# Patient Record
Sex: Female | Born: 1965 | Race: White | Hispanic: No | Smoking: Current every day smoker
Health system: Southern US, Community
[De-identification: ages and names within clinical notes are randomized; demographics above are authoritative.]

## PROBLEM LIST (undated history)

## (undated) DIAGNOSIS — N200 Calculus of kidney: Secondary | ICD-10-CM

## (undated) DIAGNOSIS — Z8742 Personal history of other diseases of the female genital tract: Secondary | ICD-10-CM

## (undated) DIAGNOSIS — R87619 Unspecified abnormal cytological findings in specimens from cervix uteri: Secondary | ICD-10-CM

## (undated) DIAGNOSIS — F329 Major depressive disorder, single episode, unspecified: Secondary | ICD-10-CM

## (undated) DIAGNOSIS — F419 Anxiety disorder, unspecified: Secondary | ICD-10-CM

## (undated) DIAGNOSIS — S68119A Complete traumatic metacarpophalangeal amputation of unspecified finger, initial encounter: Secondary | ICD-10-CM

## (undated) DIAGNOSIS — Z9889 Other specified postprocedural states: Secondary | ICD-10-CM

## (undated) DIAGNOSIS — D219 Benign neoplasm of connective and other soft tissue, unspecified: Secondary | ICD-10-CM

## (undated) DIAGNOSIS — F32A Depression, unspecified: Secondary | ICD-10-CM

## (undated) DIAGNOSIS — IMO0002 Reserved for concepts with insufficient information to code with codable children: Secondary | ICD-10-CM

## (undated) DIAGNOSIS — D649 Anemia, unspecified: Secondary | ICD-10-CM

## (undated) DIAGNOSIS — O09219 Supervision of pregnancy with history of pre-term labor, unspecified trimester: Secondary | ICD-10-CM

## (undated) DIAGNOSIS — K5790 Diverticulosis of intestine, part unspecified, without perforation or abscess without bleeding: Secondary | ICD-10-CM

## (undated) HISTORY — PX: WISDOM TOOTH EXTRACTION: SHX21

## (undated) HISTORY — PX: TONSILLECTOMY AND ADENOIDECTOMY: SUR1326

## (undated) HISTORY — PX: AMPUTATION FINGER / THUMB: SUR24

## (undated) HISTORY — DX: Complete traumatic metacarpophalangeal amputation of unspecified finger, initial encounter: S68.119A

## (undated) HISTORY — DX: Other specified postprocedural states: Z98.890

## (undated) HISTORY — DX: Reserved for concepts with insufficient information to code with codable children: IMO0002

## (undated) HISTORY — DX: Personal history of other diseases of the female genital tract: Z87.42

## (undated) HISTORY — PX: CERVIX LESION DESTRUCTION: SHX591

## (undated) HISTORY — DX: Unspecified abnormal cytological findings in specimens from cervix uteri: R87.619

## (undated) HISTORY — DX: Calculus of kidney: N20.0

## (undated) HISTORY — DX: Diverticulosis of intestine, part unspecified, without perforation or abscess without bleeding: K57.90

## (undated) HISTORY — DX: Supervision of pregnancy with history of pre-term labor, unspecified trimester: O09.219

## (undated) HISTORY — DX: Benign neoplasm of connective and other soft tissue, unspecified: D21.9

## (undated) HISTORY — DX: Anxiety disorder, unspecified: F41.9

## (undated) HISTORY — PX: ENDOMETRIAL ABLATION: SHX621

## (undated) HISTORY — DX: Depression, unspecified: F32.A

## (undated) HISTORY — DX: Anemia, unspecified: D64.9

## (undated) HISTORY — DX: Major depressive disorder, single episode, unspecified: F32.9

---

## 1998-05-16 ENCOUNTER — Other Ambulatory Visit: Admission: RE | Admit: 1998-05-16 | Discharge: 1998-05-16 | Payer: Self-pay | Admitting: Obstetrics & Gynecology

## 2000-03-25 ENCOUNTER — Other Ambulatory Visit: Admission: RE | Admit: 2000-03-25 | Discharge: 2000-03-25 | Payer: Self-pay | Admitting: Obstetrics and Gynecology

## 2001-06-02 ENCOUNTER — Other Ambulatory Visit: Admission: RE | Admit: 2001-06-02 | Discharge: 2001-06-02 | Payer: Self-pay | Admitting: Obstetrics and Gynecology

## 2001-06-09 ENCOUNTER — Encounter: Payer: Self-pay | Admitting: Obstetrics and Gynecology

## 2001-06-09 ENCOUNTER — Ambulatory Visit (HOSPITAL_COMMUNITY): Admission: RE | Admit: 2001-06-09 | Discharge: 2001-06-09 | Payer: Self-pay | Admitting: Obstetrics and Gynecology

## 2002-06-08 ENCOUNTER — Other Ambulatory Visit: Admission: RE | Admit: 2002-06-08 | Discharge: 2002-06-08 | Payer: Self-pay | Admitting: Obstetrics and Gynecology

## 2007-04-28 ENCOUNTER — Ambulatory Visit (HOSPITAL_COMMUNITY): Admission: RE | Admit: 2007-04-28 | Discharge: 2007-04-28 | Payer: Self-pay | Admitting: *Deleted

## 2011-02-22 ENCOUNTER — Other Ambulatory Visit (HOSPITAL_COMMUNITY): Payer: Self-pay | Admitting: Obstetrics and Gynecology

## 2011-02-22 DIAGNOSIS — Z1231 Encounter for screening mammogram for malignant neoplasm of breast: Secondary | ICD-10-CM

## 2011-02-27 ENCOUNTER — Ambulatory Visit (HOSPITAL_COMMUNITY)
Admission: RE | Admit: 2011-02-27 | Discharge: 2011-02-27 | Disposition: A | Discharge status: Home / Self Care | Payer: Self-pay | Source: Ambulatory Visit | Attending: Obstetrics and Gynecology | Admitting: Obstetrics and Gynecology

## 2011-02-27 DIAGNOSIS — Z1231 Encounter for screening mammogram for malignant neoplasm of breast: Secondary | ICD-10-CM

## 2011-03-01 ENCOUNTER — Emergency Department (HOSPITAL_COMMUNITY)
Admission: EM | Admit: 2011-03-01 | Discharge: 2011-03-01 | Disposition: A | Discharge status: Home / Self Care | Payer: No Typology Code available for payment source | Attending: Emergency Medicine | Admitting: Emergency Medicine

## 2011-03-01 ENCOUNTER — Encounter (HOSPITAL_COMMUNITY): Payer: Self-pay | Admitting: *Deleted

## 2011-03-01 ENCOUNTER — Emergency Department (HOSPITAL_COMMUNITY): Payer: No Typology Code available for payment source

## 2011-03-01 DIAGNOSIS — S6990XA Unspecified injury of unspecified wrist, hand and finger(s), initial encounter: Secondary | ICD-10-CM | POA: Insufficient documentation

## 2011-03-01 DIAGNOSIS — S6980XA Other specified injuries of unspecified wrist, hand and finger(s), initial encounter: Secondary | ICD-10-CM | POA: Insufficient documentation

## 2011-03-01 DIAGNOSIS — W230XXA Caught, crushed, jammed, or pinched between moving objects, initial encounter: Secondary | ICD-10-CM | POA: Insufficient documentation

## 2011-03-01 DIAGNOSIS — IMO0002 Reserved for concepts with insufficient information to code with codable children: Secondary | ICD-10-CM | POA: Insufficient documentation

## 2011-03-01 DIAGNOSIS — S68626A Partial traumatic transphalangeal amputation of right little finger, initial encounter: Secondary | ICD-10-CM

## 2011-03-01 DIAGNOSIS — M79609 Pain in unspecified limb: Secondary | ICD-10-CM | POA: Insufficient documentation

## 2011-03-01 DIAGNOSIS — S61209A Unspecified open wound of unspecified finger without damage to nail, initial encounter: Secondary | ICD-10-CM | POA: Insufficient documentation

## 2011-03-01 LAB — POCT I-STAT, CHEM 8
BUN: 17 mg/dL (ref 6–23)
Creatinine, Ser: 0.6 mg/dL (ref 0.50–1.10)
Sodium: 141 mEq/L (ref 135–145)
TCO2: 26 mmol/L (ref 0–100)

## 2011-03-01 LAB — CBC
HCT: 41.3 % (ref 36.0–46.0)
MCH: 26.9 pg (ref 26.0–34.0)
MCHC: 31.2 g/dL (ref 30.0–36.0)
MCV: 86 fL (ref 78.0–100.0)
RDW: 16 % — ABNORMAL HIGH (ref 11.5–15.5)
WBC: 5.9 10*3/uL (ref 4.0–10.5)

## 2011-03-01 MED ORDER — CEFAZOLIN SODIUM 1-5 GM-% IV SOLN
1.0000 g | Freq: Once | INTRAVENOUS | Status: DC
  Filled 2011-03-01: qty 50

## 2011-03-01 MED ORDER — LORAZEPAM 2 MG/ML IJ SOLN
1.0000 mg | Freq: Once | INTRAMUSCULAR | Status: AC
  Administered 2011-03-01: 1 mg via INTRAVENOUS
  Filled 2011-03-01: qty 1

## 2011-03-01 MED ORDER — HYDROMORPHONE HCL PF 1 MG/ML IJ SOLN
INTRAMUSCULAR | Status: AC
  Filled 2011-03-01: qty 1

## 2011-03-01 MED ORDER — ONDANSETRON HCL 4 MG/2ML IJ SOLN
4.0000 mg | Freq: Once | INTRAMUSCULAR | Status: AC
  Administered 2011-03-01: 4 mg via INTRAVENOUS
  Filled 2011-03-01: qty 2

## 2011-03-01 MED ORDER — HYDROMORPHONE HCL PF 1 MG/ML IJ SOLN
0.5000 mg | Freq: Once | INTRAMUSCULAR | Status: AC
  Administered 2011-03-01: 0.5 mg via INTRAVENOUS
  Filled 2011-03-01: qty 1

## 2011-03-01 MED ORDER — HYDROMORPHONE HCL PF 1 MG/ML IJ SOLN: 0.5000 mg | Freq: Once | INTRAMUSCULAR | Status: DC

## 2011-03-01 MED ORDER — BUPIVACAINE HCL (PF) 0.5 % IJ SOLN
INTRAMUSCULAR | Status: AC
  Filled 2011-03-01: qty 10

## 2011-03-01 MED ORDER — LORAZEPAM 1 MG PO TABS: 0.5000 mg | ORAL_TABLET | Freq: Three times a day (TID) | ORAL | Status: AC | PRN

## 2011-03-01 MED ORDER — TETANUS-DIPHTH-ACELL PERTUSSIS 5-2.5-18.5 LF-MCG/0.5 IM SUSP
0.5000 mL | Freq: Once | INTRAMUSCULAR | Status: AC
  Administered 2011-03-01: 0.5 mL via INTRAMUSCULAR
  Filled 2011-03-01: qty 0.5

## 2011-03-01 NOTE — ED Provider Notes (Signed)
I saw and evaluated the patient, reviewed the resident's note and I agree with the findings and plan.  Cyndra Numbers, MD 03/01/11 2216

## 2011-03-01 NOTE — ED Notes (Signed)
Provided pt with primary care resources for f/u.

## 2011-03-01 NOTE — ED Notes (Signed)
Pt amputated half of her right pinky finger with a wood chopper.  Pt has finger with her.  Bleeding controlled

## 2011-03-01 NOTE — ED Provider Notes (Signed)
History     CSN: [id number]  Arrival date & time 03/01/11  1304   First MD Initiated Contact with Patient 03/01/11 1310      Chief Complaint  Patient presents with  . Hand Injury    (Consider location/radiation/quality/duration/timing/severity/associated sxs/prior treatment) Patient is a 46 y.o. female presenting with hand injury. The history is provided by the patient.  Hand Injury  The incident occurred less than 1 hour ago. The incident occurred at home. Injury mechanism: finger caught in wood splitter. Pain location: right little finger. The quality of the pain is described as aching. The pain is moderate. The pain has been constant since the incident. Pertinent negatives include no fever and no malaise/fatigue. It is unknown if a foreign body is present. The symptoms are aggravated by movement. She has tried elevation and ice for the symptoms. The treatment provided mild relief.    History reviewed. No pertinent past medical history.  Past Surgical History  Procedure Date  . Cesarean section     No family history on file.  History  Substance Use Topics  . Smoking status: Current Everyday Smoker  . Smokeless tobacco: Not on file  . Alcohol Use: Yes    OB History    Grav Para Term Preterm Abortions TAB SAB Ect Mult Living                  Review of Systems  Constitutional: Negative for fever, chills, malaise/fatigue, activity change, appetite change and fatigue.  HENT: Negative for congestion, sore throat, rhinorrhea, neck pain and neck stiffness.   Eyes: Negative for photophobia, redness and visual disturbance.  Respiratory: Negative for cough, shortness of breath and wheezing.   Cardiovascular: Negative for chest pain, palpitations and leg swelling.  Gastrointestinal: Negative for nausea, vomiting, abdominal pain, diarrhea, constipation and blood in stool.  Genitourinary: Negative for dysuria, urgency, hematuria and flank pain.  Musculoskeletal: Negative for  back pain.  Skin: Positive for wound (cut off tip of little finger right hand). Negative for rash.  Neurological: Negative for dizziness, seizures, facial asymmetry, speech difficulty, weakness, light-headedness, numbness and headaches.  Psychiatric/Behavioral: Negative for confusion.  All other systems reviewed and are negative.    Allergies  Penicillins  Home Medications   Current Outpatient Rx  Name Route Sig Dispense Refill  . IBUPROFEN 200 MG PO TABS Oral Take 200 mg by mouth every 6 (six) hours as needed. For pain    . ADULT MULTIVITAMIN W/MINERALS CH Oral Take 1 tablet by mouth daily.    Marland Kitchen ESTROVEN PO Oral Take 1 tablet by mouth daily.    Marland Kitchen VITAMIN B-12 500 MCG PO TABS Oral Take 1,000 mcg by mouth daily.    Marland Kitchen VITAMIN D (CHOLECALCIFEROL) PO Oral Take 1 tablet by mouth daily.      BP 189/93  Pulse 104  Temp(Src) 98 F (36.7 C) (Oral)  Resp 25  SpO2 98%  LMP 12/28/2010  Physical Exam  Nursing note and vitals reviewed. Constitutional: She is oriented to person, place, and time. She appears well-developed and well-nourished.  Non-toxic appearance. No distress.  HENT:  Head: Normocephalic and atraumatic.  Mouth/Throat: Oropharynx is clear and moist.  Eyes: Conjunctivae and EOM are normal. Pupils are equal, round, and reactive to light. No scleral icterus.  Neck: Normal range of motion. Neck supple. No JVD present.  Cardiovascular: Normal rate, regular rhythm, normal heart sounds and intact distal pulses.   No murmur heard. Pulmonary/Chest: Effort normal and breath sounds normal. No  respiratory distress. She has no wheezes. She has no rales.  Abdominal: Soft. Bowel sounds are normal. She exhibits no distension. There is no tenderness. There is no rebound and no guarding.  Musculoskeletal: Normal range of motion.       Arms:      Partial amputation of little finger of right hand. Amputation at middle phalanx. Exposed bone present. No active bleeding. Motion at MCP joint  intact.   Neurological: She is alert and oriented to person, place, and time. She has normal strength. No cranial nerve deficit. GCS eye subscore is 4. GCS verbal subscore is 5. GCS motor subscore is 6.  Skin: Skin is warm and dry. No rash noted. She is not diaphoretic.  Psychiatric: She has a normal mood and affect.    ED Course  Procedures (including critical care time)  Labs Reviewed  CBC - Abnormal; Notable for the following:    RDW 16.0 (*)    All other components within normal limits  POCT I-STAT, CHEM 8 - Abnormal; Notable for the following:    Glucose, Bld 109 (*)    Calcium, Ion 1.40 (*)    All other components within normal limits  PROTIME-INR  I-STAT, CHEM 8   Dg Hand Complete Right  03/01/2011  *RADIOLOGY REPORT*  Clinical Data: Amputation of the distal fifth digit.  Caught in wood splitter.  RIGHT HAND - COMPLETE 3+ VIEW  Comparison: None.  Findings: There is amputation of the fifth digit, at the mid shaft of the middle phalanx.  Proximal interphalangeal joint is intact. Osseous structures are otherwise unremarkable.  IMPRESSION: Partial amputation of the fifth digit, at the mid shaft of the middle phalanx.  Original Report Authenticated By: Reyes Ivan, M.D.     1. Partial traumatic transphalangeal amputation of right little finger       MDM  45yo CF with PMH significant for anxiety who presents to the ED due to finger amputation. Occurred while placing wood in a wood splitter and her finger got caught in the machine. Exposed bone present. No active bleeding. Giving ancef and updated TDAP. Giving dilaudid. Patient requesting low dose pain medication because pain meds make her nauseous. Xray ordered.   Hand surgery consulted on arrival. He is on his way to the ED.   Hand xray with no other injuries.   Hand surgeon says not reimplantable amputation. He is revising wound.   Hand surgeon giving percocet and doxy will f/u with pt in clinic. Pt requesting help  with scripts as no insurance.   Social work providing resources for medications. Will give her a few doses of ativan for her anxiety related to injury and pain.      Verne Carrow, MD 03/01/11 [phone]

## 2011-03-01 NOTE — ED Notes (Signed)
Dr. Allie Bossier completed procedure, pt verbalizes understanding of follow up care & prescriptions, family at bedside, pt remains calm & cooperative, denies pain, NAD

## 2011-03-02 NOTE — Consult Note (Signed)
Kathleen Melendez, Kathleen Melendez NO.:  [id number]  MEDICAL RECORD NO.:  [id number]  LOCATION:  CDU11                        FACILITY:  [facility]  PHYSICIAN:  Kathleen Melendez, M.D.DATE OF BIRTH:  06-26-1965  DATE OF CONSULTATION:  03/01/2011 DATE OF DISCHARGE:  03/01/2011                                CONSULTATION   CONSULTING PHYSICIAN:  ER MD/EDP.  REASON FOR CONSULTATION:  Traumatic amputation of right 3rd finger through the middle phalanx.  HISTORY OF PRESENT ILLNESS:  [name] is a 46 year old who was chopping wood in her yard today.  She has a wood burning stove.  She accidentally cut off her right hand 5th finger through the middle phalanx.  She brought the finger to the emergency room with her.  She reports this is her only injury.  PAST MEDICAL HISTORY:  No active medical problems.  ALLERGIES:  Penicillin.  MEDICATIONS:  No active medications.  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  REVIEW OF SYSTEMS:  Negative for chest pain, shortness of breath, fever, chills, nausea, and vomiting.  PHYSICAL EXAM:  She is afebrile.  Stable vital signs.  Recent examination of her right hand 5th finger shows a jagged exposed bone of the middle phalanx with crushed soft tissue.  The distal amputation piece is with her, but is not viable for replantation.  When I explained this in length that replantation at this level does not work and she has too much crushed tissue.  ASSESSMENT:  Right fifth finger traumatic amputation through middle phalanx.  PLAN:  Given the nature of the crushed bone in this area as well as the soft tissues, they will allow a replantation and given the historical fact that these do not do well at this level, she wished to proceed with a revision amputation which we can do here in the emergency room under local block.  We will perform this in the emergency room and I will dictate this in a separate procedure note.   I will then see her back in 3-4 days after procedure and I will give her information for antibiotics, pain medication, and followup information.     Kathleen Melendez, M.D.     CYB/MEDQ  D:  03/01/2011  T:  03/02/2011  Job:  [id number]

## 2011-03-02 NOTE — Op Note (Signed)
NAMEBETTYLOU, [name] NO.:  [id number]  MEDICAL RECORD NO.:  [id number]  LOCATION:  CDU11                        FACILITY:  [facility]  PHYSICIAN:  Vanita Panda. Magnus Ivan, M.D.DATE OF BIRTH:  11/20/1965  DATE OF PROCEDURE:  03/01/2011 DATE OF DISCHARGE:  03/01/2011                              OPERATIVE REPORT   PREPROCEDURE DIAGNOSIS:  Right hand fifth finger traumatic amputation through the middle phalanx.  POSTPROCEDURE DIAGNOSIS:  Right hand fifth finger traumatic amputation through the middle phalanx.  PROCEDURE:  Revision amputation of right hand fifth finger.  SURGEON:  Kathleen Poisson MD.  ANESTHESIA: 1. 2% plain lidocaine digital block. 2. 0.25% plain bupivacaine digital block.  BLOOD LOSS:  Minimal.  COMPLICATIONS:  None.  INDICATIONS:  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  She was chopping wood and she actually cut off her fifth finger on her right dominant hand to the middle phalanx.  This is a jagged cut and she brought the finger with her to the emergency room; however this is not in level they can have a replant.  I talked to her about revising this with backing up the bone and then closing the tissue over the bone at the level of the amputation.  She understood this and did wish to proceed with the procedure.  Again, this is a AAR procedure.  PROCEDURE DESCRIPTION:  I did clean her hand with Betadine and provided a digital block first with 2% plain lidocaine followed by 0.25% plain bupivacaine.  I got adequate anesthesia with this.  There was exposed bone of the middle phalanx and crest tissue around this.  I debrided the crest tissue sharply with a knife and I used a rongeur to back the bone up to a level of record bring the flap over.  I cauterized the neurovascular bundles with electrocautery, and then I clean the tissues thoroughly.  There was no gross debris in the wound.  I fashioned a  flap to bring the skin margins together with interrupted 4-0 Prolene suture.  I then placed a Xeroform and well-padded dressing and she tolerated this well and was discharged from the emergency room with no problems.  I will see her back in the office in 3-4 days.     Vanita Panda. Magnus Ivan, M.D.     CYB/MEDQ  D:  03/01/2011  T:  03/02/2011  Job:  [id number]

## 2011-04-27 ENCOUNTER — Emergency Department (HOSPITAL_COMMUNITY)
Admission: EM | Admit: 2011-04-27 | Discharge: 2011-04-27 | Disposition: A | Discharge status: Home / Self Care | Payer: Medicaid Other | Attending: Emergency Medicine | Admitting: Emergency Medicine

## 2011-04-27 ENCOUNTER — Emergency Department (HOSPITAL_COMMUNITY): Payer: Medicaid Other

## 2011-04-27 ENCOUNTER — Encounter (HOSPITAL_COMMUNITY): Payer: Self-pay | Admitting: *Deleted

## 2011-04-27 DIAGNOSIS — R109 Unspecified abdominal pain: Secondary | ICD-10-CM | POA: Insufficient documentation

## 2011-04-27 DIAGNOSIS — M549 Dorsalgia, unspecified: Secondary | ICD-10-CM | POA: Insufficient documentation

## 2011-04-27 DIAGNOSIS — N200 Calculus of kidney: Secondary | ICD-10-CM | POA: Insufficient documentation

## 2011-04-27 DIAGNOSIS — N23 Unspecified renal colic: Secondary | ICD-10-CM

## 2011-04-27 DIAGNOSIS — N201 Calculus of ureter: Secondary | ICD-10-CM | POA: Insufficient documentation

## 2011-04-27 LAB — PREGNANCY, URINE: Preg Test, Ur: NEGATIVE

## 2011-04-27 LAB — URINE MICROSCOPIC-ADD ON

## 2011-04-27 LAB — URINALYSIS, ROUTINE W REFLEX MICROSCOPIC
Glucose, UA: NEGATIVE mg/dL
Protein, ur: NEGATIVE mg/dL
Specific Gravity, Urine: 1.021 (ref 1.005–1.030)

## 2011-04-27 MED ORDER — HYDROCODONE-ACETAMINOPHEN 5-325 MG PO TABS
2.0000 | ORAL_TABLET | Freq: Once | ORAL | Status: AC
  Administered 2011-04-27: 2 via ORAL
  Filled 2011-04-27: qty 2

## 2011-04-27 MED ORDER — ONDANSETRON HCL 8 MG PO TABS: 8.0000 mg | ORAL_TABLET | Freq: Three times a day (TID) | ORAL | Status: AC | PRN

## 2011-04-27 MED ORDER — ONDANSETRON 4 MG PO TBDP
4.0000 mg | ORAL_TABLET | Freq: Once | ORAL | Status: AC
  Administered 2011-04-27: 4 mg via ORAL
  Filled 2011-04-27: qty 1

## 2011-04-27 MED ORDER — OXYCODONE-ACETAMINOPHEN 5-325 MG PO TABS: 1.0000 | ORAL_TABLET | Freq: Four times a day (QID) | ORAL | Status: AC | PRN

## 2011-04-27 NOTE — ED Notes (Signed)
PT SEEN AND ASSESSED BY DR. Richardo Priest. PT TO BE D/C TO HOME

## 2011-04-27 NOTE — ED Provider Notes (Signed)
History     CSN: [id number]  Arrival date & time 04/27/11  1330   First MD Initiated Contact with Patient 04/27/11 1425      Chief Complaint  Patient presents with  . Flank Pain    (Consider location/radiation/quality/duration/timing/severity/associated sxs/prior treatment) Patient is a 46 y.o. female presenting with flank pain. The history is provided by the patient.  Flank Pain Pertinent negatives include no chest pain, no abdominal pain, no headaches and no shortness of breath.  pt c/o right flank pain since yesterday. Constant, dull, nonradiating, waxing and waning intensity. Similar to prior kidney stone pain. No anterior/abd pain. No nvd. No constipation. No vaginal discharge or bleeding. States unsure when last period, is perimenopausal and states has numerous fibroids. No dysuria or hematuria. No fever or chills. No back injury or strain. No radicular pain. No numbness/weakness.   History reviewed. No pertinent past medical history.  Past Surgical History  Procedure Date  . Cesarean section   . Cesarean section   . Amputation finger / thumb     History reviewed. No pertinent family history.  History  Substance Use Topics  . Smoking status: Current Everyday Smoker  . Smokeless tobacco: Not on file  . Alcohol Use: Yes    OB History    Grav Para Term Preterm Abortions TAB SAB Ect Mult Living                  Review of Systems  Constitutional: Negative for fever and chills.  HENT: Negative for neck pain.   Respiratory: Negative for cough and shortness of breath.   Cardiovascular: Negative for chest pain.  Gastrointestinal: Negative for abdominal pain.  Genitourinary: Positive for flank pain.  Musculoskeletal: Positive for back pain.  Skin: Negative for rash.  Neurological: Negative for headaches.  Hematological: Does not bruise/bleed easily.  Psychiatric/Behavioral: Negative for confusion.    Allergies  Penicillins  Home Medications   Current  Outpatient Rx  Name Route Sig Dispense Refill  . AMOXICILLIN 250 MG PO CAPS Oral Take 500 mg by mouth once.    . IBUPROFEN 200 MG PO TABS Oral Take 200 mg by mouth every 6 (six) hours as needed. For pain    . ADULT MULTIVITAMIN W/MINERALS CH Oral Take 1 tablet by mouth daily.    Marland Kitchen ESTROVEN PO Oral Take 1 tablet by mouth daily.    Marland Kitchen VITAMIN B-12 500 MCG PO TABS Oral Take 1,000 mcg by mouth daily.    Marland Kitchen VITAMIN D (CHOLECALCIFEROL) PO Oral Take 1 tablet by mouth daily.      BP 160/88  Pulse 68  Temp(Src) 97.9 F (36.6 C) (Oral)  Resp 18  SpO2 100%  Physical Exam  Nursing note and vitals reviewed. Constitutional: She is oriented to person, place, and time. She appears well-developed and well-nourished. No distress.  Eyes: Conjunctivae are normal. No scleral icterus.  Neck: Neck supple. No tracheal deviation present.  Cardiovascular: Normal rate, regular rhythm, normal heart sounds and intact distal pulses.   Pulmonary/Chest: Effort normal and breath sounds normal. No respiratory distress.  Abdominal: Soft. Normal appearance and bowel sounds are normal. She exhibits no distension and no mass. There is no tenderness. There is no rebound and no guarding.  Genitourinary:       No cva tenderness  Musculoskeletal: She exhibits no edema.       tls spine non tender, aligned, no step off  Neurological: She is alert and oriented to person, place, and time.  Steady gait.   Skin: Skin is warm and dry. No rash noted.  Psychiatric: She has a normal mood and affect.    ED Course  Procedures (including critical care time)   Labs Reviewed  URINALYSIS, ROUTINE W REFLEX MICROSCOPIC  PREGNANCY, URINE   Results for orders placed during the hospital encounter of 04/27/11  URINALYSIS, ROUTINE W REFLEX MICROSCOPIC      Component Value Range   Color, Urine YELLOW  YELLOW    APPearance CLEAR  CLEAR    Specific Gravity, Urine 1.021  1.005 - 1.030    pH 6.0  5.0 - 8.0    Glucose, UA NEGATIVE   NEGATIVE (mg/dL)   Hgb urine dipstick LARGE (*) NEGATIVE    Bilirubin Urine NEGATIVE  NEGATIVE    Ketones, ur NEGATIVE  NEGATIVE (mg/dL)   Protein, ur NEGATIVE  NEGATIVE (mg/dL)   Urobilinogen, UA 0.2  0.0 - 1.0 (mg/dL)   Nitrite NEGATIVE  NEGATIVE    Leukocytes, UA NEGATIVE  NEGATIVE   PREGNANCY, URINE      Component Value Range   Preg Test, Ur NEGATIVE  NEGATIVE   URINE MICROSCOPIC-ADD ON      Component Value Range   Squamous Epithelial / LPF FEW (*) RARE    WBC, UA 0-2  <3 (WBC/hpf)   RBC / HPF 11-20  <3 (RBC/hpf)   Bacteria, UA FEW (*) RARE    Ct Abdomen Pelvis Wo Contrast  04/27/2011  *RADIOLOGY REPORT*  Clinical Data: Right flank pain.  Dysuria.  History of uterine fibroids.  CT ABDOMEN AND PELVIS WITHOUT CONTRAST 04/27/2011:  Technique:  Multidetector CT imaging of the abdomen and pelvis was performed following the standard protocol without intravenous contrast.  Comparison: None.  Findings: Obstructing approximate 4 mm calculus at the right ureterovesical junction causing moderate right hydronephrosis, marked right renal edema, and marked right perinephric edema.  No other right urinary tract calculi.  At least 4 calculi in lower pole calyces of the left kidney, the largest measuring approximately 5 x 9 mm.  No left ureteral calculi and no left urinary tract obstruction.  Within the limits of the unenhanced technique, no focal parenchymal abnormality involving either kidney.  Normal unenhanced appearance of the liver, spleen, pancreas, and adrenal glands.  Gallbladder unremarkable by CT.  No biliary ductal dilation.  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  No significant lymphadenopathy.  Stomach and small bowel normal in appearance.  Extensive sigmoid colon diverticulosis without evidence of acute diverticulitis; remainder of the colon unremarkable.  Normal appendix in the right upper pelvis.  No ascites.  Uterus enlarged containing fibroids, including a calcified,  degenerated fibroid arising from the fundus.  No adnexal masses or free pelvic fluid.  Phleboliths low in both sides of the pelvis. Bone window images demonstrate degenerative changes in the facet joints of the lower lumbar spine.  Visualized lung bases clear.  IMPRESSION:  1.  Obstructing approximate 4 mm calculus at the right UVJ. 2.  At least 4 calculi in lower pole calyces of the left kidney, non-obstructive, the largest approximating 5 x 9 mm. 3.  Sigmoid colon diverticulosis without evidence of acute diverticulitis. 4.  Enlarged uterus containing fibroids.  Original Report Authenticated By: Arnell Sieving, M.D.      MDM  Labs.  vicodin po (pt has ride, does not have to drive).    Recheck pain resolved. abd soft nt. Discussed ct w pt.      Suzi Roots, MD 04/27/11 [phone]

## 2011-04-27 NOTE — Discharge Instructions (Signed)
Take motrin or aleve as need for pain. You may also take percocet as need for pain. No driving for the next 6 hours or when taking percocet. Also, do not take tylenol or acetaminophen containing medication when taking percocet.    Your ct scan was read as showing a 4 mm right sided kidney stone at the UVJ. Incidental note was also made of 4 kidney stones in the left kidney, and diverticula of the colon - discuss with your doctor at follow up with them.  Follow up with urologist in coming week if symptoms fail to improve/resolve.  Your blood pressure is high today - follow up with primary care doctor for recheck in the next couple weeks.   Return to ER if worse, severe pain, persistent vomiting, fevers, other concern.  You were given pain medication in the ER - no driving for the next 6 hours.       Kidney Stones Kidney stones (ureteral lithiasis) are deposits that form inside your kidneys. The intense pain is caused by the stone moving through the urinary tract. When the stone moves, the ureter goes into spasm around the stone. The stone is usually passed in the urine.  CAUSES   A disorder that makes certain neck glands produce too much parathyroid hormone (primary hyperparathyroidism).   A buildup of uric acid crystals.   Narrowing (stricture) of the ureter.   A kidney obstruction present at birth (congenital obstruction).   Previous surgery on the kidney or ureters.   Numerous kidney infections.  SYMPTOMS   Feeling sick to your stomach (nauseous).   Throwing up (vomiting).   Blood in the urine (hematuria).   Pain that usually spreads (radiates) to the groin.   Frequency or urgency of urination.  DIAGNOSIS   Taking a history and physical exam.   Blood or urine tests.   Computerized X-ray scan (CT scan).   Occasionally, an examination of the inside of the urinary bladder (cystoscopy) is performed.  TREATMENT   Observation.   Increasing your fluid intake.    Surgery may be needed if you have severe pain or persistent obstruction.  The size, location, and chemical composition are all important variables that will determine the proper choice of action for you. Talk to your caregiver to better understand your situation so that you will minimize the risk of injury to yourself and your kidney.  HOME CARE INSTRUCTIONS   Drink enough water and fluids to keep your urine clear or pale yellow.   Strain all urine through the provided strainer. Keep all particulate matter and stones for your caregiver to see. The stone causing the pain may be as small as a grain of salt. It is very important to use the strainer each and every time you pass your urine. The collection of your stone will allow your caregiver to analyze it and verify that a stone has actually passed.   Only take over-the-counter or prescription medicines for pain, discomfort, or fever as directed by your caregiver.   Make a follow-up appointment with your caregiver as directed.   Get follow-up X-rays if required. The absence of pain does not always mean that the stone has passed. It may have only stopped moving. If the urine remains completely obstructed, it can cause loss of kidney function or even complete destruction of the kidney. It is your responsibility to make sure X-rays and follow-ups are completed. Ultrasounds of the kidney can show blockages and the status of the kidney. Ultrasounds are  not associated with any radiation and can be performed easily in a matter of minutes.  SEEK IMMEDIATE MEDICAL CARE IF:   Pain cannot be controlled with the prescribed medicine.   You have a fever.   The severity or intensity of pain increases over 18 hours and is not relieved by pain medicine.   You develop a new onset of abdominal pain.   You feel faint or pass out.  MAKE SURE YOU:   Understand these instructions.   Will watch your condition.   Will get help right away if you are not doing  well or get worse.  Document Released: 01/22/2005 Document Revised: 01/11/2011 Document Reviewed: 05/20/2009 [facility] Patient Information 2012 [place], [place].     Diverticulosis Diverticulosis is a common condition that develops when small pouches (diverticula) form in the wall of the colon. The risk of diverticulosis increases with age. It happens more often in people who eat a low-fiber diet. Most individuals with diverticulosis have no symptoms. Those individuals with symptoms usually experience abdominal pain, constipation, or loose stools (diarrhea). HOME CARE INSTRUCTIONS   Increase the amount of fiber in your diet as directed by your caregiver or dietician. This may reduce symptoms of diverticulosis.   Your caregiver may recommend taking a dietary fiber supplement.   Drink at least 6 to 8 glasses of water each day to prevent constipation.   Try not to strain when you have a bowel movement.   Your caregiver may recommend avoiding nuts and seeds to prevent complications, although this is still an uncertain benefit.   Only take over-the-counter or prescription medicines for pain, discomfort, or fever as directed by your caregiver.  FOODS WITH HIGH FIBER CONTENT INCLUDE:  Fruits. Apple, peach, pear, tangerine, raisins, prunes.   Vegetables. Brussels sprouts, asparagus, broccoli, cabbage, carrot, cauliflower, romaine lettuce, spinach, summer squash, tomato, winter squash, zucchini.   Starchy Vegetables. Baked beans, kidney beans, lima beans, split peas, lentils, potatoes (with skin).   Grains. Whole wheat bread, brown rice, bran flake cereal, plain oatmeal, white rice, shredded wheat, bran muffins.  SEEK IMMEDIATE MEDICAL CARE IF:   You develop increasing pain or severe bloating.   You have an oral temperature above 102 F (38.9 C), not controlled by medicine.   You develop vomiting or bowel movements that are bloody or black.  Document Released: 10/20/2003 Document Revised:  01/11/2011 Document Reviewed: 06/22/2009 [facility] Patient Information 2012 [place], [place].

## 2011-04-27 NOTE — ED Notes (Signed)
Patient is alert and oriented x3.  She is complaining of right flank pain that started yesterday  And has increased in severity.  She is rating her pain 8 of 10. She states that she has had A kidney stone in the past.  Currently she is having frequency, urgency and difficulty urinating.

## 2011-06-12 ENCOUNTER — Encounter: Payer: Self-pay | Admitting: Obstetrics and Gynecology

## 2011-06-12 ENCOUNTER — Ambulatory Visit (INDEPENDENT_AMBULATORY_CARE_PROVIDER_SITE_OTHER): Payer: Medicaid Other | Admitting: Obstetrics and Gynecology

## 2011-06-12 VITALS — BP 132/60 | HR 60 | Ht 65.0 in | Wt 127.0 lb

## 2011-06-12 DIAGNOSIS — N200 Calculus of kidney: Secondary | ICD-10-CM

## 2011-06-12 DIAGNOSIS — F419 Anxiety disorder, unspecified: Secondary | ICD-10-CM | POA: Insufficient documentation

## 2011-06-12 DIAGNOSIS — F411 Generalized anxiety disorder: Secondary | ICD-10-CM

## 2011-06-12 DIAGNOSIS — N920 Excessive and frequent menstruation with regular cycle: Secondary | ICD-10-CM

## 2011-06-12 DIAGNOSIS — Z01419 Encounter for gynecological examination (general) (routine) without abnormal findings: Secondary | ICD-10-CM

## 2011-06-12 DIAGNOSIS — Z124 Encounter for screening for malignant neoplasm of cervix: Secondary | ICD-10-CM

## 2011-06-12 DIAGNOSIS — D259 Leiomyoma of uterus, unspecified: Secondary | ICD-10-CM

## 2011-06-12 DIAGNOSIS — D649 Anemia, unspecified: Secondary | ICD-10-CM

## 2011-06-12 DIAGNOSIS — Z0000 Encounter for general adult medical examination without abnormal findings: Secondary | ICD-10-CM

## 2011-06-12 DIAGNOSIS — R87619 Unspecified abnormal cytological findings in specimens from cervix uteri: Secondary | ICD-10-CM | POA: Insufficient documentation

## 2011-06-12 DIAGNOSIS — D219 Benign neoplasm of connective and other soft tissue, unspecified: Secondary | ICD-10-CM

## 2011-06-12 DIAGNOSIS — IMO0002 Reserved for concepts with insufficient information to code with codable children: Secondary | ICD-10-CM | POA: Insufficient documentation

## 2011-06-12 NOTE — Progress Notes (Signed)
Regular Periods: no Mammogram: yes  Monthly Breast Ex.: yes C/O SORE BREAST Exercise: yes  Tetanus < 10 years: yes Seatbelts: yes  NI. Bladder Functn.: yes Abuse at home: no  Daily BM's: yes Stressful Work: yes  Healthy Diet: yes Sigmoid-Colonoscopy: NEVER  Calcium: yes Medical problems this year: FIBROIDS   LAST PAP:2011 NL  Contraception: CONDOMS  Mammogram:  02/2011 NL PCP:DR. Lerry Liner  PMH: NO CHANGE  FMH: NO CHANGE  Last Bone Scan:NEVER  PT IS SEPARATED

## 2011-06-12 NOTE — Progress Notes (Signed)
Subjective:    Kathleen Melendez is a 46 y.o. female, G2P0011, who presents for an annual exam. Patient has a long history of uterine fibroids and with recent CT evaluation had that fact confirmed.  After passing the renal stone the patient bled heavy  x 3 weeks using a  box of 40 tampons in 48 hours along with long pads.  Patient had 10/10 cramps that decreased to 7/10 with Ibuprofen 800 mg. She then was without bleeding x 1 week but then bled heavy x 1 week again.  She gives a history of 2 months without a period in the past year along with vasomotor symptoms but they have subsided.  Denes lightheadedness, chest pain, or  shortness of breath.  Menstrual cycle:   LMP: Patient's last menstrual period was 04/27/2011.  See HPI for characteristics.            Review of Systems Pertinent items are noted in HPI. Denies pelvic pain, uti symptoms, vaginitis symptoms,  menopausal symptoms, change in bowel habits or rectal bleeding   Objective:    BP 132/60  Pulse 60  Ht 5\' 5"  (1.651 m)  Wt 127 lb (57.607 kg)  BMI 21.13 kg/m2  LMP 04/27/2011   Wt Readings from Last 1 Encounters:  06/12/11 127 lb (57.607 kg)   BMI: Body mass index is 21.13 kg/(m^2). General Appearance: Alert with . No acute distress HEENT: Grossly normal Neck / Thyroid: Supple, no thyromegaly or cervical adenopathy Lungs: clear to auscultation bilaterally Back: No CVA tenderness Breast Exam: No masses or nodes.No dimpling, nipple retraction or discharge. Cardiovascular: Regular rate and rhythm.  Gastrointestinal: Soft, non-tender, no masses or organomegaly Pelvic Exam: EGBUS-wnl, vagina-normal rugae, cervix- without lesions or tenderness, uterus 16-18 weeks size, slightly mobile,  adnexae-no masses or tenderness Rectovaginal: hemorrhoids and normal sphincter tone Lymphatic Exam: Non-palpable nodes in neck, clavicular,  axillary, or inguinal regions  Skin: no rashes or abnormalities Extremities: no clubbing cyanosis or edema    Neurologic: grossly normal Psychiatric: Alert and oriented, appropriate affect.    Assessment:   Routine GYN Exam Large Symptomatic Uterine Fibroids Menorrhagia   Plan:  Reviewed medical and surgical management options for fibroids and menorrhagia to include: Mirena, fibroid embolization, myomectomy, ablation and hysterectomy but patient wants hysterectomy  PAP sent  To notify Dr. Su Hilt of patient's request for surgery Reviewed endometrial biopsy-patient has had before.   Aftan Vint,ELMIRAPA-C

## 2011-06-20 ENCOUNTER — Telehealth: Payer: Self-pay | Admitting: Obstetrics and Gynecology

## 2011-06-20 ENCOUNTER — Telehealth: Payer: Self-pay

## 2011-06-20 NOTE — Telephone Encounter (Signed)
Pt returned call and stated that she has made an appt to come in on tomorrow 06/21/2011 @ 4:00 for bleeding with Dr. Su Hilt.  Kathleen Melendez

## 2011-06-20 NOTE — Telephone Encounter (Signed)
Left message for pt to return call. Pt left message earlier regarding request for RX for bleeding until surgery. Kathleen Melendez

## 2011-06-20 NOTE — Telephone Encounter (Signed)
Jackie/AR surg

## 2011-06-21 ENCOUNTER — Other Ambulatory Visit: Payer: Self-pay | Admitting: Obstetrics and Gynecology

## 2011-06-21 ENCOUNTER — Encounter: Payer: Self-pay | Admitting: Obstetrics and Gynecology

## 2011-06-21 ENCOUNTER — Ambulatory Visit (INDEPENDENT_AMBULATORY_CARE_PROVIDER_SITE_OTHER): Payer: Medicaid Other | Admitting: Obstetrics and Gynecology

## 2011-06-21 VITALS — BP 124/70 | Ht 65.0 in | Wt 136.0 lb

## 2011-06-21 DIAGNOSIS — Z139 Encounter for screening, unspecified: Secondary | ICD-10-CM

## 2011-06-21 DIAGNOSIS — N920 Excessive and frequent menstruation with regular cycle: Secondary | ICD-10-CM

## 2011-06-21 MED ORDER — TRANEXAMIC ACID 650 MG PO TABS: 1300.0000 mg | ORAL_TABLET | Freq: Three times a day (TID) | ORAL | Status: DC

## 2011-06-21 NOTE — Progress Notes (Signed)
Pt seen by EP c/o menorrhagia and requesting hysterectomy. She previously did not undergo ablation secondary to ins issues H/o csection x 1 and no other surgeries  Filed Vitals:   06/21/11 1612  BP: 124/70   ROS: noncontributory  Lungs CTA bil CV RRR  Pelvic exam:  VULVA: normal appearing vulva with no masses, tenderness or lesions,  VAGINA: normal appearing vagina with normal color and discharge, no lesions, CERVIX: normal appearing cervix without discharge or lesions,  UTERUS: uterus is about 12-14 wk size, shape, consistency and nontender,  ADNEXA: normal adnexa in size, nontender and no masses.  A/P Menorrhagia -  Labs U/s and Em Bx - next available Hysterectomy scheduled for 08/07/11 May consider TLH or LAVH depending on U/S but will likely need morcellation

## 2011-06-22 LAB — CBC
HCT: 24.9 % — ABNORMAL LOW (ref 36.0–46.0)
Hemoglobin: 7.4 g/dL — ABNORMAL LOW (ref 12.0–15.0)
MCH: 25.1 pg — ABNORMAL LOW (ref 26.0–34.0)
MCHC: 29.7 g/dL — ABNORMAL LOW (ref 30.0–36.0)
MCV: 84.4 fL (ref 78.0–100.0)

## 2011-06-22 LAB — VITAMIN D 25 HYDROXY (VIT D DEFICIENCY, FRACTURES): Vit D, 25-Hydroxy: 28 ng/mL — ABNORMAL LOW (ref 30–89)

## 2011-06-24 ENCOUNTER — Other Ambulatory Visit: Payer: Self-pay | Admitting: Obstetrics and Gynecology

## 2011-06-25 ENCOUNTER — Telehealth: Payer: Self-pay

## 2011-06-25 ENCOUNTER — Other Ambulatory Visit: Payer: Self-pay

## 2011-06-25 NOTE — Telephone Encounter (Signed)
Left message for pt to return call, protocol per Dr. Su Hilt regarding meds. Dr. Su Hilt was advised that pt did not want to proceed with Depo-provera due to hysterectomy in July. Kathleen Melendez

## 2011-06-25 NOTE — Telephone Encounter (Signed)
Pt was called and given Vit-d protocol. Rx was called to [facility] pharmacy for Vit-d softjels 50,000 units 1 capsule 1x wk for 12 wks #20 0 rf. Pt was also advised of severe anemia and to Take OTC FeSO4 325 mg 1 po tid with colace. Pt was also advised to start Dep-Provera ASAP. Pt stated that she is scheduled to have a hysterectomy in July and did not want to start the Depo-Provera. Vit-d ordered for future lab draw. Mathis Bud

## 2011-06-25 NOTE — Telephone Encounter (Signed)
Pt returned called regarding meds. Pt was advised to continue meds prescribed for bleeding as required as bleeding should stop or decrease. Also to take OTC Motrin for pain. Pt will be traveling in the next few days and will call the office back if bleeding continues to get refill on med if bleeding is not better. Mathis Bud

## 2011-06-25 NOTE — Telephone Encounter (Signed)
Pt called and stated that she is having pain with bleeding wanted to know what to take. Pt was also concerned that she may not have enough med that was prescribed for the bleeding maybe just for the next few days. Mathis Bud

## 2011-06-26 ENCOUNTER — Other Ambulatory Visit: Payer: Self-pay

## 2011-06-26 ENCOUNTER — Other Ambulatory Visit: Payer: Self-pay | Admitting: Obstetrics and Gynecology

## 2011-06-26 ENCOUNTER — Telehealth: Payer: Self-pay | Admitting: Obstetrics and Gynecology

## 2011-06-26 ENCOUNTER — Other Ambulatory Visit (INDEPENDENT_AMBULATORY_CARE_PROVIDER_SITE_OTHER): Payer: Medicaid Other

## 2011-06-26 DIAGNOSIS — Z3009 Encounter for other general counseling and advice on contraception: Secondary | ICD-10-CM

## 2011-06-26 MED ORDER — MEDROXYPROGESTERONE ACETATE 150 MG/ML IM SUSP
150.0000 mg | Freq: Once | INTRAMUSCULAR | Status: AC
  Administered 2011-06-26: 150 mg via INTRAMUSCULAR

## 2011-06-26 MED ORDER — MEDROXYPROGESTERONE ACETATE 150 MG/ML IM SUSP: 150.0000 mg | Freq: Once | INTRAMUSCULAR | Status: DC

## 2011-06-26 NOTE — Telephone Encounter (Signed)
Returned pt's call regarding desire to get the Depo-Provera injection. Pt has now decided to get the injection due to heavy bleeding although she will be having a hysterectomy in July. RX called in to [facility] pharmacy for Depo-provera 150 mg 0rf. Pt will have lab ov today. Mathis Bud

## 2011-06-26 NOTE — Progress Notes (Unsigned)
Next Depo due 09-17-2011 

## 2011-06-26 NOTE — Telephone Encounter (Signed)
TAH scheduled for 08/07/11@ 1:00 with AR/ND; Patient has MCD. Adrianne Pridgen

## 2011-06-26 NOTE — Telephone Encounter (Signed)
Pam/return call °

## 2011-06-26 NOTE — Telephone Encounter (Signed)
Entered in error

## 2011-07-03 ENCOUNTER — Telehealth: Payer: Self-pay

## 2011-07-09 ENCOUNTER — Ambulatory Visit (INDEPENDENT_AMBULATORY_CARE_PROVIDER_SITE_OTHER): Payer: Medicaid Other | Admitting: Obstetrics and Gynecology

## 2011-07-09 ENCOUNTER — Ambulatory Visit (INDEPENDENT_AMBULATORY_CARE_PROVIDER_SITE_OTHER): Payer: Medicaid Other

## 2011-07-09 ENCOUNTER — Other Ambulatory Visit: Payer: Self-pay | Admitting: Obstetrics and Gynecology

## 2011-07-09 ENCOUNTER — Encounter: Payer: Self-pay | Admitting: Obstetrics and Gynecology

## 2011-07-09 VITALS — BP 120/62 | Ht 65.0 in | Wt 132.0 lb

## 2011-07-09 DIAGNOSIS — D649 Anemia, unspecified: Secondary | ICD-10-CM

## 2011-07-09 DIAGNOSIS — N920 Excessive and frequent menstruation with regular cycle: Secondary | ICD-10-CM

## 2011-07-09 DIAGNOSIS — N926 Irregular menstruation, unspecified: Secondary | ICD-10-CM

## 2011-07-09 NOTE — Progress Notes (Signed)
No new complaints.  Anticipating surgery 7/2.  Depo Provera not controlling bleeding but it has decreased some. Here for EmBx today and f/u u/s.  Reports h/o C/S.  Filed Vitals:   07/09/11 1211  BP: 120/62   Bx performed per protocol  Pipelle passed x 3 to 12-13cm U/S Reviewed Pelvic Ut bulky about 12wk size, mildly tender  A/P Plan LAVH, poss TAH, poss cytoscopy Will likely require morcellation R/B/A discussed incl but not limited to bleeding, infection and injury Pt agreeable to blood transfusion Cont Fe TID

## 2011-07-11 LAB — PATHOLOGY

## 2011-07-11 NOTE — Telephone Encounter (Signed)
Top close encounter. Kathleen Melendez

## 2011-07-27 ENCOUNTER — Encounter: Payer: Self-pay | Admitting: Obstetrics and Gynecology

## 2011-07-27 DIAGNOSIS — Z8679 Personal history of other diseases of the circulatory system: Secondary | ICD-10-CM

## 2011-07-27 DIAGNOSIS — Z8659 Personal history of other mental and behavioral disorders: Secondary | ICD-10-CM

## 2011-07-27 DIAGNOSIS — Z8751 Personal history of pre-term labor: Secondary | ICD-10-CM

## 2011-07-27 DIAGNOSIS — D219 Benign neoplasm of connective and other soft tissue, unspecified: Secondary | ICD-10-CM | POA: Insufficient documentation

## 2011-07-27 DIAGNOSIS — Z9889 Other specified postprocedural states: Secondary | ICD-10-CM

## 2011-07-29 ENCOUNTER — Encounter (HOSPITAL_COMMUNITY): Payer: Self-pay | Admitting: Pharmacist

## 2011-07-30 ENCOUNTER — Encounter (HOSPITAL_COMMUNITY): Payer: Self-pay

## 2011-07-30 ENCOUNTER — Encounter: Payer: Self-pay | Admitting: Obstetrics and Gynecology

## 2011-07-30 ENCOUNTER — Encounter (HOSPITAL_COMMUNITY)
Admission: RE | Admit: 2011-07-30 | Discharge: 2011-07-30 | Disposition: A | Discharge status: Home / Self Care | Payer: Medicaid Other | Source: Ambulatory Visit | Attending: Obstetrics and Gynecology | Admitting: Obstetrics and Gynecology

## 2011-07-30 ENCOUNTER — Ambulatory Visit (INDEPENDENT_AMBULATORY_CARE_PROVIDER_SITE_OTHER): Payer: Medicaid Other | Admitting: Obstetrics and Gynecology

## 2011-07-30 VITALS — BP 138/70 | HR 64 | Temp 99.0°F | Resp 16 | Ht 65.0 in | Wt 132.0 lb

## 2011-07-30 DIAGNOSIS — Z01818 Encounter for other preprocedural examination: Secondary | ICD-10-CM

## 2011-07-30 LAB — CBC
HCT: 36.2 % (ref 36.0–46.0)
Hemoglobin: 11.4 g/dL — ABNORMAL LOW (ref 12.0–15.0)
MCH: 28.6 pg (ref 26.0–34.0)
MCHC: 31.5 g/dL (ref 30.0–36.0)
MCV: 90.7 fL (ref 78.0–100.0)

## 2011-07-30 LAB — SURGICAL PCR SCREEN: Staphylococcus aureus: POSITIVE — AB

## 2011-07-30 NOTE — Patient Instructions (Addendum)
YOUR PROCEDURE IS SCHEDULED ON:08/07/11  ENTER THROUGH THE MAIN ENTRANCE OF [facility] AT:1130 am  USE DESK PHONE AND DIAL [id number] TO INFORM Kathleen Melendez OF YOUR ARRIVAL  CALL [phone] IF YOU HAVE ANY QUESTIONS OR PROBLEMS PRIOR TO YOUR ARRIVAL.  REMEMBER: DO NOT EAT AFTER MIDNIGHT :Monday  SPECIAL INSTRUCTIONS:clear liquids until 9am on Tuesday   YOU MAY BRUSH YOUR TEETH THE MORNING OF SURGERY   TAKE THESE MEDICINES THE DAY OF SURGERY WITH SIP OF WATER: pain med if needed   DO NOT WEAR JEWELRY, EYE MAKEUP, LIPSTICK OR DARK FINGERNAIL POLISH DO NOT WEAR LOTIONS  DO NOT SHAVE FOR 48 HOURS PRIOR TO SURGERY  YOU WILL NOT BE ALLOWED TO DRIVE YOURSELF HOME.

## 2011-07-30 NOTE — Progress Notes (Signed)
Kathleen Melendez is a 46 y.o. female G2P0011 who presents for a total abdominal hysterectomy because of menometrorrhagia and severe anemia. For the past five years patient has had vaginal bleeding two weeks out of each month with occasional spotting at other times.  She will use 18 tampons in a 24 hour period on her heaviest days and experience 10/10 cramping that will decrease to 6/10 with Ibuprofen 800 mg and 3/10 with Vicodin.  Admits to positional dyspareunia  and fatigue, but denies changes in bowel movements, urinary tract symptoms, lightheadedness, shortness of breath or chest pain. In May 2013, patient's H/H= 7.4/24.9 respectively but her TSH was normal.  Pelvic ultrasound (07/2011) uterus 12.88 x 7.36 x 6.80 cm, a submucosal fibroid 5.9 x 6.2 x 4.0 cm precluded good visualization of the endometrium,  there was also a left anterior subserosal fibroid 3.9 x 5.2 x 5.0 cm and an anterior intramural fibroid 3.0 x 2.6 x 2.4 cm; both ovaries appeared normal.  Patient was given Depo Provera in May to help with her menstrual flow and she states that it has been lighter but no shorter.  She is taking iron twice daily in an effort to improve her hemoglobin.  A review of both medical and surgicial management options were given to the patient however, she would like to proceed with definitive therapy in the form of hysterectomy.  Past Medical History  OB History: G2P0011 C-section, 1998  GYN History: menarche 46 YO    LMP 06/29/11    Contracepton Depo-Provera   The patient denies history of sexually transmitted disease. 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 Ascorbic Acid (VITAMIN C) 100 MG tablet Take 100 mg by mouth daily.     . docusate sodium (COLACE) 100 MG capsule Take 100 mg by mouth 3 (three) times daily.     . ferrous sulfate 325 (65 FE) MG tablet Take 325 mg by mouth 3 (three) times daily with meals.      . HYDROcodone-acetaminophen (NORCO) 5-325 MG per tablet Take 1 tablet by mouth every 8 (eight) hours as needed. For pain     . medroxyPROGESTERone (DEPO-PROVERA) 150 MG/ML injection Inject 1 mL (150 mg total) into the muscle once.  1 mL  0 . vitamin B-12 (CYANOCOBALAMIN) 500 MCG tablet Take 1,000 mcg by mouth daily.     . Vitamin D, Ergocalciferol, (DRISDOL) 50000 UNITS CAPS Take 50,000 Units by mouth every 7 (seven) days.       Allergies Allergen Reactions . Penicillins Rash  Denies sensitivity to  shellfish, soy, adhesives, peanuts or latex  ROS: + glasses/contact lenses, denies headache, vision changes, dysphagia, tinnitus, dizziness,  chest pain, shortness of breath, nausea, vomiting, diarrhea, dysuria, hematuria, pelvic pain, swelling of joints,easy bruising,  myalgias, arthralgias, skin rashes and except as is mentioned in the history of present illness, patient's review of systems is otherwise negative  Physical Exam    BP 138/70  Pulse 64  Temp 99 F (37.2 C) (Oral)  Resp 16  Ht 5' 5" (1.651 m)    Wt 132 lb (59.875 kg)  BMI 21.97 kg/m2  LMP 06/29/2011  Neck: supple without masses or thyromegaly Lungs: clear to auscultation Heart: regular rate and rhythm Abdomen: soft, non-tender and no organomegaly Pelvic:EGBUS- wnl; vagina-mild atrophy; cervix without lesions or motion tenderness; adnexae-no tenderness or masses Extremities:  no clubbing, cyanosis or edema   Assesment:Menometrorrhagia                    Symptomatic Uterine Fibroids                     Severe Anemia   Disposition:  A discussion was held with patient regarding the indication for her procedure(s) along with the risks, which include but are not limited to: reaction to anesthesia, damage to adjacent organs, infection and excessive bleeding. Patient verbalized understanding of these risks and has consented to proceed with a total abdominal hysterectomy at [facility] on August 07, 2011 t 1 p.m.   CSN# [id number]   Star Resler J. Quint Chestnut, PA-C  for Dr.  Angela Y. Roberts  

## 2011-07-31 NOTE — H&P (Signed)
Kathleen Melendez is a 46 y.o. female G2P0011 who presents for a total abdominal hysterectomy because of menometrorrhagia and severe anemia. For the past five years patient has had vaginal bleeding two weeks out of each month with occasional spotting at other times.  She will use 18 tampons in a 24 hour period on her heaviest days and experience 10/10 cramping that will decrease to 6/10 with Ibuprofen 800 mg and 3/10 with Vicodin.  Admits to positional dyspareunia  and fatigue, but denies changes in bowel movements, urinary tract symptoms, lightheadedness, shortness of breath or chest pain. In May 2013, patient's H/H= 7.4/24.9 respectively but her TSH was normal.  Pelvic ultrasound (07/2011) uterus 12.88 x 7.36 x 6.80 cm, a submucosal fibroid 5.9 x 6.2 x 4.0 cm precluded good visualization of the endometrium,  there was also a left anterior subserosal fibroid 3.9 x 5.2 x 5.0 cm and an anterior intramural fibroid 3.0 x 2.6 x 2.4 cm; both ovaries appeared normal.  Patient was given Depo Provera in May to help with her menstrual flow and she states that it has been lighter but no shorter.  She is taking iron twice daily in an effort to improve her hemoglobin.  A review of both medical and surgicial management options were given to the patient however, she would like to proceed with definitive therapy in the form of hysterectomy.  Past Medical History  OB History: G2P0011 C-section, 3  GYN History: menarche 46 YO    LMP 06/29/11    Contracepton Depo-Provera   The patient denies history of sexually transmitted disease. 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 Ascorbic Acid (VITAMIN C) 100 MG tablet Take 100 mg by mouth daily.     Marland Kitchen docusate sodium (COLACE) 100 MG capsule Take 100 mg by mouth 3 (three) times daily.     . ferrous sulfate 325 (65 FE) MG tablet Take 325 mg by mouth 3 (three) times daily with meals.      Marland Kitchen HYDROcodone-acetaminophen (NORCO) 5-325 MG per tablet Take 1 tablet by mouth every 8 (eight) hours as needed. For pain     . medroxyPROGESTERone (DEPO-PROVERA) 150 MG/ML injection Inject 1 mL (150 mg total) into the muscle once.  1 mL  0 . vitamin B-12 (CYANOCOBALAMIN) 500 MCG tablet Take 1,000 mcg by mouth daily.     . Vitamin D, Ergocalciferol, (DRISDOL) 50000 UNITS CAPS Take 50,000 Units by mouth every 7 (seven) days.       Allergies Allergen Reactions . Penicillins Rash  Denies sensitivity to  shellfish, soy, adhesives, peanuts or latex  ROS: + glasses/contact lenses, denies headache, vision changes, dysphagia, tinnitus, dizziness,  chest pain, shortness of breath, nausea, vomiting, diarrhea, dysuria, hematuria, pelvic pain, swelling of joints,easy bruising,  myalgias, arthralgias, skin rashes and except as is mentioned in the history of present illness, patient's review of systems is otherwise negative  Physical Exam    BP 138/70  Pulse 64  Temp 99 F (37.2 C) (Oral)  Resp 16  Ht 5\' 5"  (1.651 m)  Wt 132 lb (59.875 kg)  BMI 21.97 kg/m2  LMP 06/29/2011  Neck: supple without masses or thyromegaly Lungs: clear to auscultation Heart: regular rate and rhythm Abdomen: soft, non-tender and no organomegaly Pelvic:EGBUS- wnl; vagina-mild atrophy; cervix without lesions or motion tenderness; adnexae-no tenderness or masses Extremities:  no clubbing, cyanosis or edema   Assesment:Menometrorrhagia                    Symptomatic Uterine Fibroids                     Severe Anemia   Disposition:  A discussion was held with patient regarding the indication for her procedure(s) along with the risks, which include but are not limited to: reaction to anesthesia, damage to adjacent organs, infection and excessive bleeding. Patient verbalized understanding of these risks and has consented to proceed with a total abdominal hysterectomy at [facility] [place] on August 07, 2011 t 1 p.m.   CSN# [id number]   Beckett Maden J. Lowell Guitar, PA-C  for Dr.  Woodroe Mode. Su Hilt

## 2011-08-01 ENCOUNTER — Other Ambulatory Visit: Payer: Self-pay | Admitting: Obstetrics and Gynecology

## 2011-08-06 ENCOUNTER — Other Ambulatory Visit: Payer: Self-pay | Admitting: Obstetrics and Gynecology

## 2011-08-06 NOTE — Op Note (Signed)
adrianne called changed surg from ab hyst to   lavh with cysto possible abd hyst  Whitten Andreoni pref ran

## 2011-08-07 ENCOUNTER — Inpatient Hospital Stay (HOSPITAL_COMMUNITY)
Admission: RE | Admit: 2011-08-07 | Discharge: 2011-08-09 | DRG: 743 | Disposition: A | Discharge status: Home / Self Care | Payer: Medicaid Other | Source: Ambulatory Visit | Attending: Obstetrics and Gynecology | Admitting: Obstetrics and Gynecology

## 2011-08-07 ENCOUNTER — Encounter (HOSPITAL_COMMUNITY): Payer: Self-pay | Admitting: *Deleted

## 2011-08-07 ENCOUNTER — Encounter (HOSPITAL_COMMUNITY): Payer: Self-pay | Admitting: Anesthesiology

## 2011-08-07 ENCOUNTER — Encounter (HOSPITAL_COMMUNITY)
Admission: RE | Disposition: A | Discharge status: Home / Self Care | Payer: Self-pay | Source: Ambulatory Visit | Attending: Obstetrics and Gynecology

## 2011-08-07 ENCOUNTER — Ambulatory Visit (HOSPITAL_COMMUNITY): Payer: Medicaid Other | Admitting: Anesthesiology

## 2011-08-07 DIAGNOSIS — Z9889 Other specified postprocedural states: Secondary | ICD-10-CM

## 2011-08-07 DIAGNOSIS — D251 Intramural leiomyoma of uterus: Secondary | ICD-10-CM | POA: Diagnosis present

## 2011-08-07 DIAGNOSIS — Z5331 Laparoscopic surgical procedure converted to open procedure: Secondary | ICD-10-CM

## 2011-08-07 DIAGNOSIS — N920 Excessive and frequent menstruation with regular cycle: Secondary | ICD-10-CM

## 2011-08-07 DIAGNOSIS — D250 Submucous leiomyoma of uterus: Secondary | ICD-10-CM | POA: Diagnosis present

## 2011-08-07 DIAGNOSIS — D259 Leiomyoma of uterus, unspecified: Secondary | ICD-10-CM

## 2011-08-07 DIAGNOSIS — D252 Subserosal leiomyoma of uterus: Secondary | ICD-10-CM | POA: Diagnosis present

## 2011-08-07 DIAGNOSIS — D649 Anemia, unspecified: Secondary | ICD-10-CM | POA: Diagnosis present

## 2011-08-07 HISTORY — PX: LAPAROSCOPY: SHX197

## 2011-08-07 HISTORY — PX: CYSTOSCOPY: SHX5120

## 2011-08-07 HISTORY — PX: ABDOMINAL HYSTERECTOMY: SHX81

## 2011-08-07 SURGERY — LAPAROSCOPY, DIAGNOSTIC
Anesthesia: General | Wound class: Clean Contaminated

## 2011-08-07 MED ORDER — IBUPROFEN 600 MG PO TABS
600.0000 mg | ORAL_TABLET | Freq: Four times a day (QID) | ORAL | Status: DC | PRN
  Administered 2011-08-08: 600 mg via ORAL
  Filled 2011-08-07: qty 1

## 2011-08-07 MED ORDER — HYDROMORPHONE HCL PF 1 MG/ML IJ SOLN
INTRAMUSCULAR | Status: AC
  Administered 2011-08-07: 0.5 mg via INTRAVENOUS
  Filled 2011-08-07: qty 1

## 2011-08-07 MED ORDER — HYDROMORPHONE 0.3 MG/ML IV SOLN
INTRAVENOUS | Status: DC
  Administered 2011-08-07: 0.999 mg via INTRAVENOUS
  Administered 2011-08-07: 19:00:00 via INTRAVENOUS
  Administered 2011-08-08: 1.39 mg via INTRAVENOUS
  Administered 2011-08-08: 0.799 mg via INTRAVENOUS
  Filled 2011-08-07: qty 25

## 2011-08-07 MED ORDER — HYDROMORPHONE HCL PF 1 MG/ML IJ SOLN
INTRAMUSCULAR | Status: AC
  Filled 2011-08-07: qty 1

## 2011-08-07 MED ORDER — LIDOCAINE HCL (CARDIAC) 20 MG/ML IV SOLN
INTRAVENOUS | Status: DC | PRN
  Administered 2011-08-07: 80 mg via INTRAVENOUS

## 2011-08-07 MED ORDER — GENTAMICIN SULFATE 40 MG/ML IJ SOLN: Freq: Once | INTRAVENOUS | Status: DC

## 2011-08-07 MED ORDER — HYDROMORPHONE HCL PF 1 MG/ML IJ SOLN
INTRAMUSCULAR | Status: DC | PRN
  Administered 2011-08-07 (×2): 1 mg via INTRAVENOUS

## 2011-08-07 MED ORDER — INDIGOTINDISULFONATE SODIUM 8 MG/ML IJ SOLN
INTRAMUSCULAR | Status: AC
  Filled 2011-08-07: qty 5

## 2011-08-07 MED ORDER — ROCURONIUM BROMIDE 50 MG/5ML IV SOLN
INTRAVENOUS | Status: AC
  Filled 2011-08-07: qty 1

## 2011-08-07 MED ORDER — LIDOCAINE HCL (CARDIAC) 20 MG/ML IV SOLN
INTRAVENOUS | Status: AC
  Filled 2011-08-07: qty 5

## 2011-08-07 MED ORDER — PROPOFOL 10 MG/ML IV EMUL
INTRAVENOUS | Status: AC
  Filled 2011-08-07: qty 20

## 2011-08-07 MED ORDER — BUPIVACAINE HCL (PF) 0.25 % IJ SOLN
INTRAMUSCULAR | Status: DC | PRN
  Administered 2011-08-07: 30 mL

## 2011-08-07 MED ORDER — GLYCOPYRROLATE 0.2 MG/ML IJ SOLN
INTRAMUSCULAR | Status: AC
  Filled 2011-08-07: qty 1

## 2011-08-07 MED ORDER — PHENYLEPHRINE 40 MCG/ML (10ML) SYRINGE FOR IV PUSH (FOR BLOOD PRESSURE SUPPORT)
PREFILLED_SYRINGE | INTRAVENOUS | Status: AC
  Filled 2011-08-07: qty 5

## 2011-08-07 MED ORDER — DEXAMETHASONE SODIUM PHOSPHATE 4 MG/ML IJ SOLN
INTRAMUSCULAR | Status: DC | PRN
  Administered 2011-08-07: 10 mg via INTRAVENOUS

## 2011-08-07 MED ORDER — KETOROLAC TROMETHAMINE 30 MG/ML IJ SOLN
INTRAMUSCULAR | Status: AC
  Filled 2011-08-07: qty 1

## 2011-08-07 MED ORDER — MIDAZOLAM HCL 5 MG/5ML IJ SOLN
INTRAMUSCULAR | Status: DC | PRN
  Administered 2011-08-07: 2 mg via INTRAVENOUS

## 2011-08-07 MED ORDER — DIPHENHYDRAMINE HCL 12.5 MG/5ML PO ELIX
12.5000 mg | ORAL_SOLUTION | Freq: Four times a day (QID) | ORAL | Status: DC | PRN
  Administered 2011-08-08: 12.5 mg via ORAL
  Filled 2011-08-07: qty 5

## 2011-08-07 MED ORDER — HYDROMORPHONE HCL PF 1 MG/ML IJ SOLN
0.2500 mg | INTRAMUSCULAR | Status: DC | PRN
  Administered 2011-08-07: 0.5 mg via INTRAVENOUS

## 2011-08-07 MED ORDER — FENTANYL CITRATE 0.05 MG/ML IJ SOLN
INTRAMUSCULAR | Status: DC | PRN
  Administered 2011-08-07 (×2): 100 ug via INTRAVENOUS
  Administered 2011-08-07 (×3): 50 ug via INTRAVENOUS

## 2011-08-07 MED ORDER — ROCURONIUM BROMIDE 100 MG/10ML IV SOLN
INTRAVENOUS | Status: DC | PRN
  Administered 2011-08-07: 40 mg via INTRAVENOUS
  Administered 2011-08-07: 10 mg via INTRAVENOUS
  Administered 2011-08-07: 5 mg via INTRAVENOUS
  Administered 2011-08-07 (×3): 10 mg via INTRAVENOUS

## 2011-08-07 MED ORDER — VASOPRESSIN 20 UNIT/ML IJ SOLN
INTRAMUSCULAR | Status: AC
  Filled 2011-08-07: qty 1

## 2011-08-07 MED ORDER — OXYCODONE-ACETAMINOPHEN 5-325 MG PO TABS
1.0000 | ORAL_TABLET | ORAL | Status: DC | PRN
  Administered 2011-08-08: 1 via ORAL
  Filled 2011-08-07: qty 2

## 2011-08-07 MED ORDER — VASOPRESSIN 20 UNIT/ML IJ SOLN
INTRAVENOUS | Status: DC | PRN
  Administered 2011-08-07: 17:00:00 via INTRAMUSCULAR

## 2011-08-07 MED ORDER — SODIUM CHLORIDE 0.9 % IJ SOLN: 9.0000 mL | INTRAMUSCULAR | Status: DC | PRN

## 2011-08-07 MED ORDER — STERILE WATER FOR IRRIGATION IR SOLN
Status: DC | PRN
  Administered 2011-08-07: 1000 mL

## 2011-08-07 MED ORDER — KETOROLAC TROMETHAMINE 30 MG/ML IJ SOLN
30.0000 mg | Freq: Four times a day (QID) | INTRAMUSCULAR | Status: DC | PRN
  Administered 2011-08-07: 30 mg via INTRAVENOUS

## 2011-08-07 MED ORDER — BUPIVACAINE HCL (PF) 0.25 % IJ SOLN
INTRAMUSCULAR | Status: AC
  Filled 2011-08-07: qty 30

## 2011-08-07 MED ORDER — GLYCOPYRROLATE 0.2 MG/ML IJ SOLN
INTRAMUSCULAR | Status: DC | PRN
  Administered 2011-08-07: 0.4 mg via INTRAVENOUS

## 2011-08-07 MED ORDER — FENTANYL CITRATE 0.05 MG/ML IJ SOLN
INTRAMUSCULAR | Status: AC
  Filled 2011-08-07: qty 5

## 2011-08-07 MED ORDER — ONDANSETRON HCL 4 MG/2ML IJ SOLN: 4.0000 mg | Freq: Four times a day (QID) | INTRAMUSCULAR | Status: DC | PRN

## 2011-08-07 MED ORDER — MIDAZOLAM HCL 2 MG/2ML IJ SOLN
INTRAMUSCULAR | Status: AC
  Filled 2011-08-07: qty 2

## 2011-08-07 MED ORDER — GENTAMICIN SULFATE 40 MG/ML IJ SOLN
Freq: Once | INTRAVENOUS | Status: AC
  Administered 2011-08-07: 13:00:00 via INTRAVENOUS
  Filled 2011-08-07: qty 7.28

## 2011-08-07 MED ORDER — ONDANSETRON HCL 4 MG/2ML IJ SOLN
INTRAMUSCULAR | Status: AC
  Filled 2011-08-07: qty 2

## 2011-08-07 MED ORDER — ONDANSETRON HCL 4 MG/2ML IJ SOLN
INTRAMUSCULAR | Status: DC | PRN
  Administered 2011-08-07: 4 mg via INTRAVENOUS

## 2011-08-07 MED ORDER — PHENYLEPHRINE HCL 10 MG/ML IJ SOLN
INTRAMUSCULAR | Status: DC | PRN
  Administered 2011-08-07 (×3): 40 ug via INTRAVENOUS

## 2011-08-07 MED ORDER — DIPHENHYDRAMINE HCL 50 MG/ML IJ SOLN
12.5000 mg | Freq: Four times a day (QID) | INTRAMUSCULAR | Status: DC | PRN
  Administered 2011-08-07 – 2011-08-08 (×2): 12.5 mg via INTRAVENOUS
  Filled 2011-08-07 (×2): qty 1

## 2011-08-07 MED ORDER — LACTATED RINGERS IV SOLN
INTRAVENOUS | Status: DC
  Administered 2011-08-07 (×4): via INTRAVENOUS

## 2011-08-07 MED ORDER — PROPOFOL 10 MG/ML IV EMUL
INTRAVENOUS | Status: DC | PRN
  Administered 2011-08-07: 200 mg via INTRAVENOUS
  Administered 2011-08-07: 50 mg via INTRAVENOUS

## 2011-08-07 MED ORDER — NEOSTIGMINE METHYLSULFATE 1 MG/ML IJ SOLN
INTRAMUSCULAR | Status: DC | PRN
  Administered 2011-08-07: 3 mg via INTRAVENOUS

## 2011-08-07 MED ORDER — NEOSTIGMINE METHYLSULFATE 1 MG/ML IJ SOLN
INTRAMUSCULAR | Status: AC
  Filled 2011-08-07: qty 10

## 2011-08-07 MED ORDER — NALOXONE HCL 0.4 MG/ML IJ SOLN: 0.4000 mg | INTRAMUSCULAR | Status: DC | PRN

## 2011-08-07 MED ORDER — MEPERIDINE HCL 25 MG/ML IJ SOLN: 6.2500 mg | INTRAMUSCULAR | Status: DC | PRN

## 2011-08-07 MED ORDER — ESTRADIOL 0.1 MG/GM VA CREA
TOPICAL_CREAM | VAGINAL | Status: AC
  Filled 2011-08-07: qty 42.5

## 2011-08-07 MED ORDER — GENTAMICIN SULFATE 40 MG/ML IJ SOLN: Freq: Once | INTRAMUSCULAR | Status: DC

## 2011-08-07 MED ORDER — METOCLOPRAMIDE HCL 5 MG/ML IJ SOLN: 10.0000 mg | Freq: Once | INTRAMUSCULAR | Status: DC | PRN

## 2011-08-07 SURGICAL SUPPLY — 87 items
BARRIER ADHS 3X4 INTERCEED (GAUZE/BANDAGES/DRESSINGS) IMPLANT
BLADE SURG 10 STRL SS (BLADE) ×2 IMPLANT
CABLE HIGH FREQUENCY MONO STRZ (ELECTRODE) IMPLANT
CANISTER SUCTION 2500CC (MISCELLANEOUS) ×2 IMPLANT
CATH ROBINSON RED A/P 16FR (CATHETERS) IMPLANT
CHLORAPREP W/TINT 26ML (MISCELLANEOUS) ×2 IMPLANT
CLOTH BEACON ORANGE TIMEOUT ST (SAFETY) ×2 IMPLANT
CONT PATH 16OZ SNAP LID 3702 (MISCELLANEOUS) ×2 IMPLANT
COVER MAYO STAND STRL (DRAPES) ×2 IMPLANT
COVER TABLE BACK 60X90 (DRAPES) ×2 IMPLANT
DECANTER SPIKE VIAL GLASS SM (MISCELLANEOUS) ×4 IMPLANT
DERMABOND ADVANCED (GAUZE/BANDAGES/DRESSINGS)
DERMABOND ADVANCED .7 DNX12 (GAUZE/BANDAGES/DRESSINGS) IMPLANT
DISSECTOR SPONGE CHERRY (GAUZE/BANDAGES/DRESSINGS) ×2 IMPLANT
DRAPE PROXIMA HALF (DRAPES) ×2 IMPLANT
ELECT REM PT RETURN 9FT ADLT (ELECTROSURGICAL) ×2
ELECTRODE REM PT RTRN 9FT ADLT (ELECTROSURGICAL) ×1 IMPLANT
EVACUATOR SMOKE 8.L (FILTER) ×2 IMPLANT
FORCEPS CUTTING 33CM 5MM (CUTTING FORCEPS) IMPLANT
GAUZE PACKING 2X5 YD STERILE (GAUZE/BANDAGES/DRESSINGS) IMPLANT
GAUZE SPONGE 4X4 16PLY XRAY LF (GAUZE/BANDAGES/DRESSINGS) ×4 IMPLANT
GLOVE BIO SURGEON STRL SZ 6.5 (GLOVE) ×2 IMPLANT
GLOVE BIO SURGEON STRL SZ7.5 (GLOVE) ×12 IMPLANT
GLOVE BIOGEL PI IND STRL 6.5 (GLOVE) ×3 IMPLANT
GLOVE BIOGEL PI IND STRL 7.0 (GLOVE) ×1 IMPLANT
GLOVE BIOGEL PI IND STRL 7.5 (GLOVE) ×3 IMPLANT
GLOVE BIOGEL PI INDICATOR 6.5 (GLOVE) ×3
GLOVE BIOGEL PI INDICATOR 7.0 (GLOVE) ×1
GLOVE BIOGEL PI INDICATOR 7.5 (GLOVE) ×3
GLOVE ECLIPSE 6.0 STRL STRAW (GLOVE) ×10 IMPLANT
GLOVE SURG SS PI 6.5 STRL IVOR (GLOVE) ×2 IMPLANT
GLOVE SURG SS PI 7.0 STRL IVOR (GLOVE) ×6 IMPLANT
GOWN PREVENTION PLUS LG XLONG (DISPOSABLE) ×6 IMPLANT
GOWN STRL REIN XL XLG (GOWN DISPOSABLE) ×10 IMPLANT
HEMOSTAT SURGICEL 2X14 (HEMOSTASIS) ×2 IMPLANT
HEMOSTAT SURGICEL 4X8 (HEMOSTASIS) IMPLANT
NEEDLE HYPO 25X1 1.5 SAFETY (NEEDLE) IMPLANT
NEEDLE INSUFFLATION 14GA 120MM (NEEDLE) ×2 IMPLANT
NEEDLE MAYO .5 CIRCLE (NEEDLE) ×2 IMPLANT
NS IRRIG 1000ML POUR BTL (IV SOLUTION) ×2 IMPLANT
OCCLUDER COLPOPNEUMO (BALLOONS) ×2 IMPLANT
PACK LAVH (CUSTOM PROCEDURE TRAY) ×2 IMPLANT
PAD OB MATERNITY 4.3X12.25 (PERSONAL CARE ITEMS) ×2 IMPLANT
PROTECTOR NERVE ULNAR (MISCELLANEOUS) ×4 IMPLANT
SCALPEL HARMONIC ACE (MISCELLANEOUS) IMPLANT
SCISSORS LAP 5X35 DISP (ENDOMECHANICALS) IMPLANT
SET CYSTO W/LG BORE CLAMP LF (SET/KITS/TRAYS/PACK) ×2 IMPLANT
SET IRRIG TUBING LAPAROSCOPIC (IRRIGATION / IRRIGATOR) ×2 IMPLANT
SLEEVE Z-THREAD 5X100MM (TROCAR) ×2 IMPLANT
SOLUTION ELECTROLUBE (MISCELLANEOUS) ×2 IMPLANT
SPONGE LAP 18X18 X RAY DECT (DISPOSABLE) ×10 IMPLANT
STAPLER VISISTAT 35W (STAPLE) IMPLANT
STRIP CLOSURE SKIN 1/4X3 (GAUZE/BANDAGES/DRESSINGS) IMPLANT
SUT CHROMIC 2 0 CT 1 (SUTURE) ×2 IMPLANT
SUT CHROMIC 2 0 SH (SUTURE) IMPLANT
SUT CHROMIC 2 0 TIES 18 (SUTURE) ×2 IMPLANT
SUT MNCRL AB 3-0 PS2 27 (SUTURE) ×2 IMPLANT
SUT MON AB 3-0 SH 27 (SUTURE) ×2
SUT MON AB 3-0 SH27 (SUTURE) ×2 IMPLANT
SUT MON AB 4-0 PS1 27 (SUTURE) ×4 IMPLANT
SUT PDS AB 1 CT1 36 (SUTURE) ×4 IMPLANT
SUT PDS AB 1 CTX 36 (SUTURE) IMPLANT
SUT PLAIN 2 0 XLH (SUTURE) ×2 IMPLANT
SUT VIC AB 0 CT1 18XCR BRD8 (SUTURE) ×2 IMPLANT
SUT VIC AB 0 CT1 27 (SUTURE) ×4
SUT VIC AB 0 CT1 27XBRD ANBCTR (SUTURE) ×4 IMPLANT
SUT VIC AB 0 CT1 36 (SUTURE) ×6 IMPLANT
SUT VIC AB 0 CT1 8-18 (SUTURE) ×2
SUT VICRYL 0 TIES 12 18 (SUTURE) ×2 IMPLANT
SUT VICRYL 0 UR6 27IN ABS (SUTURE) IMPLANT
SYR 50ML LL SCALE MARK (SYRINGE) ×2 IMPLANT
SYR CONTROL 10ML LL (SYRINGE) ×2 IMPLANT
SYR TB 1ML 27GX1/2 SAFE (SYRINGE) ×1 IMPLANT
SYR TB 1ML 27GX1/2 SAFETY (SYRINGE) ×1
SYR TB 1ML LUER SLIP (SYRINGE) IMPLANT
TIP UTERINE 5.1X6CM LAV DISP (MISCELLANEOUS) IMPLANT
TIP UTERINE 6.7X10CM GRN DISP (MISCELLANEOUS) IMPLANT
TIP UTERINE 6.7X6CM WHT DISP (MISCELLANEOUS) IMPLANT
TIP UTERINE 6.7X8CM BLUE DISP (MISCELLANEOUS) IMPLANT
TOWEL OR 17X24 6PK STRL BLUE (TOWEL DISPOSABLE) ×6 IMPLANT
TRAY FOLEY CATH 14FR (SET/KITS/TRAYS/PACK) ×2 IMPLANT
TROCAR BALLN 12MMX100 BLUNT (TROCAR) IMPLANT
TROCAR HASSON GELL 12X100 (TROCAR) ×2 IMPLANT
TROCAR Z-THREAD FIOS 11X100 BL (TROCAR) ×2 IMPLANT
TROCAR Z-THREAD FIOS 5X100MM (TROCAR) ×2 IMPLANT
WARMER LAPAROSCOPE (MISCELLANEOUS) ×2 IMPLANT
WATER STERILE IRR 1000ML POUR (IV SOLUTION) ×4 IMPLANT

## 2011-08-07 NOTE — H&P (View-Only) (Signed)
Kathleen Melendez is a 46 y.o. female G2P0011 who presents for a total abdominal hysterectomy because of menometrorrhagia and severe anemia. For the past five years patient has had vaginal bleeding two weeks out of each month with occasional spotting at other times.  She will use 18 tampons in a 24 hour period on her heaviest days and experience 10/10 cramping that will decrease to 6/10 with Ibuprofen 800 mg and 3/10 with Vicodin.  Admits to positional dyspareunia  and fatigue, but denies changes in bowel movements, urinary tract symptoms, lightheadedness, shortness of breath or chest pain. In May 2013, patient's H/H= 7.4/24.9 respectively but her TSH was normal.  Pelvic ultrasound (07/2011) uterus 12.88 x 7.36 x 6.80 cm, a submucosal fibroid 5.9 x 6.2 x 4.0 cm precluded good visualization of the endometrium,  there was also a left anterior subserosal fibroid 3.9 x 5.2 x 5.0 cm and an anterior intramural fibroid 3.0 x 2.6 x 2.4 cm; both ovaries appeared normal.  Patient was given Depo Provera in May to help with her menstrual flow and she states that it has been lighter but no shorter.  She is taking iron twice daily in an effort to improve her hemoglobin.  A review of both medical and surgicial management options were given to the patient however, she would like to proceed with definitive therapy in the form of hysterectomy.  Past Medical History  OB History: G2P0011 C-section, 1998  GYN History: menarche 46 YO    LMP 06/29/11    Contracepton Depo-Provera   The patient denies history of sexually transmitted disease. 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 Ascorbic Acid (VITAMIN C) 100 MG tablet Take 100 mg by mouth daily.     . docusate sodium (COLACE) 100 MG capsule Take 100 mg by mouth 3 (three) times daily.     . ferrous sulfate 325 (65 FE) MG tablet Take 325 mg by mouth 3 (three) times daily with meals.      . HYDROcodone-acetaminophen (NORCO) 5-325 MG per tablet Take 1 tablet by mouth every 8 (eight) hours as needed. For pain     . medroxyPROGESTERone (DEPO-PROVERA) 150 MG/ML injection Inject 1 mL (150 mg total) into the muscle once.  1 mL  0 . vitamin B-12 (CYANOCOBALAMIN) 500 MCG tablet Take 1,000 mcg by mouth daily.     . Vitamin D, Ergocalciferol, (DRISDOL) 50000 UNITS CAPS Take 50,000 Units by mouth every 7 (seven) days.       Allergies Allergen Reactions . Penicillins Rash  Denies sensitivity to  shellfish, soy, adhesives, peanuts or latex  ROS: + glasses/contact lenses, denies headache, vision changes, dysphagia, tinnitus, dizziness,  chest pain, shortness of breath, nausea, vomiting, diarrhea, dysuria, hematuria, pelvic pain, swelling of joints,easy bruising,  myalgias, arthralgias, skin rashes and except as is mentioned in the history of present illness, patient's review of systems is otherwise negative  Physical Exam    BP 138/70  Pulse 64  Temp 99 F (37.2 C) (Oral)  Resp 16  Ht 5' 5" (1.651 m)    Wt 132 lb (59.875 kg)  BMI 21.97 kg/m2  LMP 06/29/2011  Neck: supple without masses or thyromegaly Lungs: clear to auscultation Heart: regular rate and rhythm Abdomen: soft, non-tender and no organomegaly Pelvic:EGBUS- wnl; vagina-mild atrophy; cervix without lesions or motion tenderness; adnexae-no tenderness or masses Extremities:  no clubbing, cyanosis or edema   Assesment:Menometrorrhagia                    Symptomatic Uterine Fibroids                     Severe Anemia   Disposition:  A discussion was held with patient regarding the indication for her procedure(s) along with the risks, which include but are not limited to: reaction to anesthesia, damage to adjacent organs, infection and excessive bleeding. Patient verbalized understanding of these risks and has consented to proceed with a total abdominal hysterectomy at [facility] on August 07, 2011 t 1 p.m.   CSN# [id number]   Zauria Dombek J. Satcha Storlie, PA-C  for Dr.  Angela Y. Roberts  

## 2011-08-07 NOTE — Anesthesia Postprocedure Evaluation (Signed)
  Anesthesia Post-op Note  Patient: Kathleen Melendez  Procedure(s) Performed: Procedure(s) (LRB): LAPAROSCOPY DIAGNOSTIC (N/A) HYSTERECTOMY ABDOMINAL (N/A) CYSTOSCOPY (N/A)  Patient Location: PACU  Anesthesia Type: General  Level of Consciousness: awake, alert  and oriented  Airway and Oxygen Therapy: Patient Spontanous Breathing  Post-op Pain: mild  Post-op Assessment: Post-op Vital signs reviewed, Patient's Cardiovascular Status Stable, Respiratory Function Stable, Patent Airway, No signs of Nausea or vomiting and Pain level controlled  Post-op Vital Signs: Reviewed and stable  Complications: No apparent anesthesia complications

## 2011-08-07 NOTE — OR Nursing (Signed)
Sodium chloride 0.9% flush administered to the field  used for Dr. Su Hilt LAPAROSCOPIC ASSISTED VAGINAL HYSTERECTOMY, ABDOMINAL HYSTERECTOMY.

## 2011-08-07 NOTE — Anesthesia Preprocedure Evaluation (Addendum)
Anesthesia Evaluation  Patient identified by MRN, date of birth, ID band Patient awake    Reviewed: Allergy & Precautions, H&P , NPO status , Patient's Chart, lab work & pertinent test results  Airway Mallampati: II TM Distance: >3 FB Neck ROM: full    Dental No notable dental hx. (+) Teeth Intact   Pulmonary Current Smoker,  breath sounds clear to auscultation  Pulmonary exam normal       Cardiovascular negative cardio ROS  Rhythm:regular Rate:Normal     Neuro/Psych PSYCHIATRIC DISORDERS Anxiety Depression negative neurological ROS     GI/Hepatic Neg liver ROS, Hx/o Diverticulosis   Endo/Other  negative endocrine ROS  Renal/GU Hx/o Renal Calculi  negative genitourinary   Musculoskeletal negative musculoskeletal ROS (+)   Abdominal Normal abdominal exam  (+)   Peds  Hematology negative hematology ROS (+) anemia ,   Anesthesia Other Findings   Reproductive/Obstetrics negative OB ROS                          Anesthesia Physical Anesthesia Plan  ASA: II  Anesthesia Plan: General ETT   Post-op Pain Management:    Induction:   Airway Management Planned:   Additional Equipment:   Intra-op Plan:   Post-operative Plan:   Informed Consent: I have reviewed the patients History and Physical, chart, labs and discussed the procedure including the risks, benefits and alternatives for the proposed anesthesia with the patient or authorized representative who has indicated his/her understanding and acceptance.   Dental Advisory Given  Plan Discussed with: Anesthesiologist, CRNA and Surgeon  Anesthesia Plan Comments:         Anesthesia Quick Evaluation

## 2011-08-07 NOTE — Progress Notes (Addendum)
Day of Surgery Procedure(s) (LRB): LAPAROSCOPY DIAGNOSTIC (N/A) HYSTERECTOMY ABDOMINAL (N/A) CYSTOSCOPY (N/A)  Subjective: Patient reports no complaints.  Pain well controlled.  Objective: I have reviewed patient's vital signs and intake and output.  General: alert and no distress Resp: clear to auscultation bilaterally Cardio: regular rate and rhythm GI: soft, appropriately tender; bowel sounds normal; no masses,  no organomegaly, dressing c/d/i Extremities: extremities normal, atraumatic, no cyanosis or edema and Homans sign is negative, no sign of DVT Vaginal Bleeding: none  Assessment: s/p Procedure(s) (LRB): LAPAROSCOPY DIAGNOSTIC (N/A) HYSTERECTOMY ABDOMINAL (N/A) CYSTOSCOPY (N/A): stable  Plan: Advance diet per protocol Labs in am Good UOP Encourage IS   LOS: 0 days    Kouper Spinella Y 08/07/2011, 9:36 PM

## 2011-08-07 NOTE — Interval H&P Note (Signed)
History and Physical Interval Note:  08/07/2011 1:13 PM  Kathleen Melendez  has presented today for surgery, with the diagnosis of Firboids, Menorrhagia  The various methods of treatment have been discussed with the patient and family. After consideration of risks, benefits and other options for treatment, the patient has consented to  Procedure(s) (LRB): LAPAROSCOPIC ASSISTED VAGINAL HYSTERECTOMY (N/A) HYSTERECTOMY ABDOMINAL (N/A) as a surgical intervention .  The patient's history has been reviewed, patient examined, no change in status, stable for surgery.  I have reviewed the patients' chart and labs.  Questions were answered to the patient's satisfaction.  The initial H&P states (per the PA) that she presents for TAH but she actually presents for LAVH, possible TLH, possible TAH, poss cytoscopy.  The patient verbalized understanding and consent was updated and initialized.  Questions answered.   Purcell Nails

## 2011-08-07 NOTE — Transfer of Care (Signed)
Immediate Anesthesia Transfer of Care Note  Patient: LAKELY ELMENDORF  Procedure(s) Performed: Procedure(s) (LRB): LAPAROSCOPY DIAGNOSTIC (N/A) HYSTERECTOMY ABDOMINAL (N/A) CYSTOSCOPY (N/A)  Patient Location: PACU  Anesthesia Type: General  Level of Consciousness: awake, alert  and oriented  Airway & Oxygen Therapy: Patient Spontanous Breathing and Patient connected to nasal cannula oxygen  Post-op Assessment: Report given to PACU RN and Post -op Vital signs reviewed and stable  Post vital signs: stable  Complications: No apparent anesthesia complications

## 2011-08-07 NOTE — Op Note (Addendum)
Preop Diagnosis: Firboids, Menorrhagia   Postop Diagnosis: Firboids, Menorrhagia   Procedure: 1.Dx Laparoscopy 2. HYSTERECTOMY ABDOMINAL 3.Cystoscopy  Anesthesia: General   Anesthesiologist: Tyrone Apple. Malen Gauze, MD   Attending: Purcell Nails, MD   Assistant: Jaymes Graff, MD  Findings: Large fibroid uterus  Pathology: Uterus and cervix measuring 881g  Fluids: 2800 cc  UOP: 300cc   EBL: 350cc  Complications: None  Procedure: The patient received intravenous antibiotics and had sequential compression devices applied to her lower extremities while in the preoperative area.   She was taken to the operating room and placed under general anesthesia without difficulty.The abdomen and perineum were prepped and draped in a sterile manner, and she was placed in a dorsal supine position.  A Foley catheter was inserted into the bladder and attached to constant drainage. After an adequate timeout was performed, a 10mm incision was made at the umbilicus and carried down to fascia and peritoneum.  A 0 vicryl purse string stitch was placed.  10mm Trocar was then placed and uterus noted to fill entire cavity.  Decision was made at that time to perform abdominal hysterectomy.  The purse string stitch was tied and skin reapproximated with 3-0 monocryl via a subcuticular stitch.  A Pfannensteil skin incision was then made. This incision was taken down to the fascia using electrocautery with care given to maintain good hemostasis. The fascia was incised in the midline and the fascial incision was then extended bilaterally using electrocautery without difficulty. The fascia was then dissected off the underlying rectus muscles using blunt and sharp dissection. The rectus muscles were split bluntly in the midline and the peritoneum entered sharply without complication. This peritoneal incision was then extended superiorly and inferiorly with care given to prevent bowel or bladder injury. Attention was  then turned to the pelvis. A retractor was placed into the incision, and the bowel was packed away with moist laparotomy sponges. The uterus at this point was noted to be mobilized and was delivered up out of the abdomen.  The bowel was packed away with moist laparotomy sponges and balfour self retaining retractor was placed. The round ligaments on each side were clamped, suture ligated with 0 Vicryl, and transected with electrocautery allowing entry into the broad ligament. Of note, all sutures used in this procedure are 0 Vicryl unless otherwise noted.  Adnexae were clamped on the patient's right side, cut, and doubly suture ligated. This procedure was repeated in an identical fashion on the left site allowing for both adnexa to remain in place.  The pedicle was then secured with a free tie.  A bladder flap was then created.  The bladder was then bluntly dissected off the lower uterine segment and cervix with good hemostasis noted. The uterine arteries were then skeletonized bilaterally and then clamped, cut, and doubly suture ligated with care given to prevent ureteral injury.  The uterus was then amputated across the lower uterine segment leaving the cervix intact.  The uterosacral ligaments were then clamped, cut, and ligated bilaterally.  Finally, the cardinal ligaments were clamped, cut, and ligated bilaterally.  Acutely curved clamps were placed across the vagina just under the cervix, and the specimen was amputated and sent to pathology. The vaginal cuff angles were closed with Heaney stiches with care given to incorporate the uterosacral-cardinal ligament pedicles on both sides. The middle of the vaginal cuff was closed with a series of interrupted figure-of-eight sutures with care given to incorporate the anterior pubocervical fascia and the posterior rectovaginal  fascia.   The pelvis was irrigated and hemostasis was reconfirmed at all pedicles and along the pelvic sidewall.  Surgicel was placed at the  top of the cuff to assure hemostasis.  All laparotomy sponges and instruments were removed from the abdomen. The peritoneum was closed with a running stitch of 2-0 chromic, the fascia was closed in a running fashion. The subcutaneous layer was reapproximated with 2-0 plain gut. The skin was closed with a 3-0 monocryl via a subcuticular stitch.   Cystoscopy was performed after indigo carmine was administered and ureters were noted to efflux without difficulty and no inadvertent bladder injury noted.  Sponge, lap, needle, and instrument counts were correct times two. The patient was taken to the recovery area awake, extubated and in stable condition.

## 2011-08-08 ENCOUNTER — Encounter (HOSPITAL_COMMUNITY): Payer: Self-pay | Admitting: Obstetrics and Gynecology

## 2011-08-08 LAB — CBC
MCH: 29.3 pg (ref 26.0–34.0)
MCHC: 31.5 g/dL (ref 30.0–36.0)
MCV: 93 fL (ref 78.0–100.0)
Platelets: 209 10*3/uL (ref 150–400)
RDW: 18.2 % — ABNORMAL HIGH (ref 11.5–15.5)

## 2011-08-08 MED ORDER — ZOLPIDEM TARTRATE 5 MG PO TABS
5.0000 mg | ORAL_TABLET | Freq: Every evening | ORAL | Status: DC | PRN
  Administered 2011-08-08: 5 mg via ORAL
  Filled 2011-08-08: qty 1

## 2011-08-08 MED ORDER — PNEUMOCOCCAL VAC POLYVALENT 25 MCG/0.5ML IJ INJ
0.5000 mL | INJECTION | INTRAMUSCULAR | Status: AC
  Administered 2011-08-09: 0.5 mL via INTRAMUSCULAR
  Filled 2011-08-08: qty 0.5

## 2011-08-08 MED ORDER — DIPHENHYDRAMINE HCL 25 MG PO CAPS
25.0000 mg | ORAL_CAPSULE | Freq: Four times a day (QID) | ORAL | Status: DC | PRN
  Administered 2011-08-08: 25 mg via ORAL
  Filled 2011-08-08: qty 1

## 2011-08-08 MED ORDER — IBUPROFEN 600 MG PO TABS
600.0000 mg | ORAL_TABLET | Freq: Four times a day (QID) | ORAL | Status: DC
  Administered 2011-08-08 – 2011-08-09 (×3): 600 mg via ORAL
  Filled 2011-08-08 (×3): qty 1

## 2011-08-08 MED ORDER — HYDROMORPHONE HCL 2 MG PO TABS
2.0000 mg | ORAL_TABLET | Freq: Four times a day (QID) | ORAL | Status: DC | PRN
  Administered 2011-08-08 – 2011-08-09 (×4): 2 mg via ORAL
  Filled 2011-08-08: qty 1
  Filled 2011-08-08: qty 2
  Filled 2011-08-08 (×2): qty 1

## 2011-08-08 NOTE — Addendum Note (Signed)
Addendum  created 08/08/11 0759 by Suella Grove, CRNA   Modules edited:Notes Section

## 2011-08-08 NOTE — Anesthesia Postprocedure Evaluation (Signed)
  Anesthesia Post-op Note  Patient: Kathleen Melendez  Procedure(s) Performed: Procedure(s) (LRB): LAPAROSCOPY DIAGNOSTIC (N/A) HYSTERECTOMY ABDOMINAL (N/A) CYSTOSCOPY (N/A)  Patient Location: [facility]  Anesthesia Type: General  Level of Consciousness: awake  Airway and Oxygen Therapy: Patient Spontanous Breathing  Post-op Pain: none  Post-op Assessment: Patient's Cardiovascular Status Stable and Respiratory Function Stable  Post-op Vital Signs: Reviewed and stable  Complications: No apparent anesthesia complications

## 2011-08-09 MED ORDER — DOCUSATE SODIUM 100 MG PO CAPS
100.0000 mg | ORAL_CAPSULE | Freq: Two times a day (BID) | ORAL | Status: DC
  Administered 2011-08-09: 100 mg via ORAL
  Filled 2011-08-09: qty 1

## 2011-08-09 MED ORDER — HYDROMORPHONE HCL 2 MG PO TABS: 2.0000 mg | ORAL_TABLET | Freq: Four times a day (QID) | ORAL | Status: AC | PRN

## 2011-08-09 MED ORDER — METOCLOPRAMIDE HCL 10 MG PO TABS
10.0000 mg | ORAL_TABLET | Freq: Once | ORAL | Status: AC
  Administered 2011-08-09: 10 mg via ORAL
  Filled 2011-08-09: qty 1

## 2011-08-09 MED ORDER — DSS 100 MG PO CAPS: 100.0000 mg | ORAL_CAPSULE | Freq: Two times a day (BID) | ORAL | Status: AC

## 2011-08-09 MED ORDER — IBUPROFEN 800 MG PO TABS: 800.0000 mg | ORAL_TABLET | Freq: Three times a day (TID) | ORAL | Status: AC | PRN

## 2011-08-09 NOTE — Progress Notes (Signed)
Pt is discharged in the care of Husband. Downstairs per ambulatory. Stable. Abdominal lapsites are clean and dry. Denies heavy vag. Bleeding  Or discomfort.Understands all discharge instructions well. Questions asked and answered.

## 2011-08-09 NOTE — Progress Notes (Signed)
UR Chart review completed.  

## 2011-08-09 NOTE — Discharge Summary (Addendum)
Physician Discharge Summary  Patient ID: Kathleen Melendez MRN: [id number] DOB/AGE: [birth year] 46 y.o.  Admit date: 08/07/2011 Discharge date: 08/09/2011  Admission Diagnoses: Large symptomatic fibroids  Discharge Diagnoses:  S/p TAH doing well  Active Problems:  * No active hospital problems. *    Discharged Condition: good  Hospital Course: Uncomplicated hospital course.  S/p TAH for large symptomatic fibroid uterus.  Routine post op care.  Slightly distended today but passing flatus.  Discharge instructions discussed.  If any N/V, call office.    Consults: None  Significant Diagnostic Studies: Hgb 9.2 Treatments: IV hydration and surgery: s/pTAH  Discharge Exam: Blood pressure 134/64, pulse 63, temperature 98 F (36.7 C), temperature source Oral, resp. rate 16, height 5\' 5"  (1.651 m), weight 132 lb (59.875 kg), SpO2 100.00%. General appearance: alert and no distress Resp: clear to auscultation bilaterally Cardio: regular rate and rhythm GI: soft, non-tender; bowel sounds normal; no masses,  no organomegaly Extremities: Homans sign is negative, no sign of DVT and no edema, redness or tenderness in the calves or thighs Incision/Wound: steristrips in place  Disposition: 01-Home or Self Care D/C instructions discussed.  If any nausea, vomiting, bleeding or any problems, call office.  F/u in 6wks.  Discharge Orders    Future Appointments: Provider: Department: Dept Phone: Center:   09/17/2011 2:00 PM Purcell Nails, MD [facility] [phone] None     Medication List  As of 08/09/2011 10:03 AM   TAKE these medications         docusate sodium 100 MG capsule   Commonly known as: COLACE   Take 100 mg by mouth up to 3 (three) times daily.      ferrous sulfate 325 (65 FE) MG tablet   Take 325 mg by mouth daily with meals.Wait one week before starting.      HYDROcodone-acetaminophen 5-325 MG per tablet   Commonly known as: NORCO   Take 1 tablet by mouth every 8 (eight)  hours as needed. For pain      HYDROmorphone 2 MG tablet   Commonly known as: DILAUDID   Take 1-2 tablets (2-4 mg total) by mouth every 6 (six) hours as needed for pain.      ibuprofen 800 MG tablet   Commonly known as: ADVIL,MOTRIN   Take 1 tablet (800 mg total) by mouth every 8 (eight) hours as needed for pain.      medroxyPROGESTERone 150 MG/ML injection   Commonly known as: DEPO-PROVERA   Inject 1 mL (150 mg total) into the muscle once. Discontinue      vitamin B-12 500 MCG tablet   Commonly known as: CYANOCOBALAMIN   Take 1,000 mcg by mouth daily.      vitamin C 100 MG tablet   Take 100 mg by mouth daily.      Vitamin D (Ergocalciferol) 50000 UNITS Caps   Commonly known as: DRISDOL   Take 50,000 Units by mouth every 7 (seven) days.             SignedPurcell Nails 08/09/2011, 10:03 AM

## 2011-08-09 NOTE — Progress Notes (Signed)
1 Days Post-Op Procedure(s) (LRB): LAPAROSCOPY DIAGNOSTIC (N/A) HYSTERECTOMY ABDOMINAL (N/A) CYSTOSCOPY (N/A)  Subjective: Patient reports no problems voiding.  Tolerating po.  Itching after percocet.  Pt preferred dilaudid.  Objective: I have reviewed patient's vital signs and intake and output.  General: alert and no distress Resp: clear to auscultation bilaterally Cardio: regular rate and rhythm GI: soft, appropriately tender; bowel sounds normal; no masses,  no organomegaly, dressing c/d/i Extremities: Homans sign is negative, no sign of DVT and no edema, redness or tenderness in the calves or thighs Vaginal Bleeding: none  Assessment: s/p Procedure(s) (LRB): LAPAROSCOPY DIAGNOSTIC (N/A) HYSTERECTOMY ABDOMINAL (N/A) CYSTOSCOPY (N/A): progressing well  Plan: Encourage ambulation Hemodynamically stable Good UOP, voiding spontaneously Anticipate d/c tomorrow     Osborn Coho Y 08/09/2011, 9:39 AM

## 2011-08-27 ENCOUNTER — Other Ambulatory Visit: Payer: Self-pay | Admitting: Obstetrics and Gynecology

## 2011-08-27 ENCOUNTER — Telehealth: Payer: Self-pay | Admitting: Obstetrics and Gynecology

## 2011-08-27 NOTE — Telephone Encounter (Signed)
LM for pt to CB re Rx refill. Kathleen Melendez A

## 2011-08-27 NOTE — Telephone Encounter (Signed)
Ar pt 

## 2011-08-27 NOTE — Telephone Encounter (Signed)
Jackie/rx °

## 2011-08-28 ENCOUNTER — Telehealth: Payer: Self-pay

## 2011-08-28 ENCOUNTER — Other Ambulatory Visit: Payer: Self-pay | Admitting: Obstetrics and Gynecology

## 2011-08-28 MED ORDER — HYDROMORPHONE HCL 2 MG PO TABS: 2.0000 mg | ORAL_TABLET | Freq: Four times a day (QID) | ORAL | Status: AC | PRN

## 2011-08-28 MED ORDER — IBUPROFEN 600 MG PO TABS: 600.0000 mg | ORAL_TABLET | Freq: Four times a day (QID) | ORAL | Status: AC | PRN

## 2011-08-30 ENCOUNTER — Telehealth: Payer: Self-pay

## 2011-08-30 NOTE — Telephone Encounter (Signed)
JACKIE/AR PT °

## 2011-08-30 NOTE — Telephone Encounter (Signed)
Spoke with pt to let her the Rx's for IB and Dilaudid are up front waiting to be picked up. Melody Comas A

## 2011-09-17 ENCOUNTER — Ambulatory Visit (INDEPENDENT_AMBULATORY_CARE_PROVIDER_SITE_OTHER): Payer: Self-pay | Admitting: Obstetrics and Gynecology

## 2011-09-17 ENCOUNTER — Encounter: Payer: Self-pay | Admitting: Obstetrics and Gynecology

## 2011-09-17 VITALS — BP 120/62 | Temp 98.2°F | Ht 65.0 in | Wt 132.0 lb

## 2011-09-17 DIAGNOSIS — Z90710 Acquired absence of both cervix and uterus: Secondary | ICD-10-CM | POA: Insufficient documentation

## 2011-09-17 NOTE — Progress Notes (Signed)
DATE OF SURGERY:08/07/2011 TYPE OF SURGERY:Hysterectomy PAIN:No VAG BLEEDING: no VAG DISCHARGE: yes NORMAL GI FUNCTN: yes NORMAL GU FUNCTN: yes   C/o spotting intermittently  Filed Vitals:   09/17/11 1513  BP: 120/62  Temp: 98.2 F (36.8 C)   ROS: noncontributory  Pelvic exam:  VULVA: normal appearing vulva with no masses, tenderness or lesions,  VAGINA: normal appearing vagina with normal color and discharge, no lesions, cuff healing well however granulation tissue id midcuff (silver nitrate applied) ADNEXA: normal adnexa in size, nontender and no masses.  Path - benign fibroids  A/P Doing well s/p TAH No IC x 1wk after spotting stops If spotting returns, rto sooner Otherwise rto in 33yr D/C iron Cont colace until stools are regular (c/o constipation)

## 2011-12-10 NOTE — Telephone Encounter (Signed)
Chart note to close encounter only. Colie Josten, Jacqueline A  

## 2012-04-14 ENCOUNTER — Ambulatory Visit: Payer: Self-pay | Admitting: Obstetrics and Gynecology

## 2012-04-14 ENCOUNTER — Encounter: Payer: Self-pay | Admitting: Obstetrics and Gynecology

## 2012-04-14 ENCOUNTER — Telehealth: Payer: Self-pay | Admitting: Obstetrics and Gynecology

## 2012-04-14 VITALS — BP 112/70 | HR 78 | Wt 141.0 lb

## 2012-04-14 DIAGNOSIS — N951 Menopausal and female climacteric states: Secondary | ICD-10-CM

## 2012-04-14 LAB — CBC
HCT: 41.2 % (ref 36.0–46.0)
MCH: 29.8 pg (ref 26.0–34.0)
MCHC: 33.5 g/dL (ref 30.0–36.0)
MCV: 89 fL (ref 78.0–100.0)
Platelets: 210 10*3/uL (ref 150–400)
RDW: 14.4 % (ref 11.5–15.5)
WBC: 10.8 10*3/uL — ABNORMAL HIGH (ref 4.0–10.5)

## 2012-04-14 MED ORDER — VENLAFAXINE HCL ER 37.5 MG PO CP24: 37.5000 mg | ORAL_CAPSULE | Freq: Every day | ORAL | Status: AC

## 2012-04-14 NOTE — Progress Notes (Signed)
Menopausal symptoms:anxiety, decreased libido, depression, hot flashes, insomnia, moodiness  The patient is not taking hormone replacement therapy The patient  is taking a Calcium supplement. The patient participates in regular exercise: yes. Post-menopausal bleeding:no  The patient is sexually active.  Last Pap: was normal May  2013 Last mammogram: was normal February  2013  History of DVT/PE: No Family history of breast cancer: No Family history of endometrial cancer:No

## 2012-04-14 NOTE — Progress Notes (Addendum)
46 YO S/P Hysterectomy 08/2011 with a history of depression presents with complaints of hot flashes, anxiety, insomnia, moodiness, feeling overwhelmed and decreased sex drive (able to get in the mood with enough stimulation).  States symptoms have been worse for the past 3 months and often doesn't  want to  get out of the bed.  Denies suicidal/homocidal ideations.  TSH and Prolactin were normal May 2013.  O:: BP 112/70  Pulse 78  Wt 141 lb (63.957 kg)  BMI 23.46 kg/m2  LMP 06/29/2011  A: Menopausal Symptoms     H/O Depression     Insomnia     Decreased Libido     H/O Severe Anemia     Vitamin D Deficiency   P: CBC, Vitamin D Deficiency-pending       Reviewed management options for menopausal symptoms: herbal, hormonal and miscellaneous      Patient wants to try miscellaneous due to her moodiness overlay       Effexor 37.5 mg #30 1 po qd 5 refills  (may increase to bid after 2 weeks)       Patient to call or come in in 4 weeks to report of benefits of medication and possible adjustment       RTO-as scheduled or prn  Ezreal Turay, PA-C

## 2012-04-14 NOTE — Telephone Encounter (Signed)
Incoming call from Rob at pharmacy. States received both messages and fill with plain not XR Effexor.

## 2012-04-14 NOTE — Telephone Encounter (Signed)
VM from pt. States [facility] does not accept E-script. Per EP, Rx for Effexor XR 37.5 mg 1 po qd x 2 weeks then 1 po bid # 60 with 5 Rf called to Rob Cochram VM at pharmacy. After consult with EP, Rx changed to Effexor 37.5 mg (Not XR) with same script called to same VM. TC to pt. Explained difference in RX and advised pt should not take 24 h XR release pill. Pt verbalizes comprehension.

## 2012-04-16 ENCOUNTER — Encounter: Payer: Self-pay | Admitting: Obstetrics and Gynecology

## 2012-05-22 ENCOUNTER — Other Ambulatory Visit: Payer: Self-pay | Admitting: Obstetrics and Gynecology

## 2012-05-22 DIAGNOSIS — Z1231 Encounter for screening mammogram for malignant neoplasm of breast: Secondary | ICD-10-CM

## 2012-05-29 ENCOUNTER — Ambulatory Visit (HOSPITAL_COMMUNITY)
Admission: RE | Admit: 2012-05-29 | Discharge: 2012-05-29 | Disposition: A | Discharge status: Home / Self Care | Payer: Self-pay | Source: Ambulatory Visit | Attending: Obstetrics and Gynecology | Admitting: Obstetrics and Gynecology

## 2012-05-29 DIAGNOSIS — Z1231 Encounter for screening mammogram for malignant neoplasm of breast: Secondary | ICD-10-CM

## 2013-09-09 ENCOUNTER — Other Ambulatory Visit (HOSPITAL_COMMUNITY): Payer: Self-pay | Admitting: Family Medicine

## 2013-09-09 DIAGNOSIS — R519 Headache, unspecified: Secondary | ICD-10-CM

## 2013-09-09 DIAGNOSIS — R51 Headache: Principal | ICD-10-CM

## 2013-09-14 ENCOUNTER — Ambulatory Visit (HOSPITAL_COMMUNITY)
Admission: RE | Admit: 2013-09-14 | Discharge: 2013-09-14 | Disposition: A | Discharge status: Home / Self Care | Payer: BC Managed Care – PPO | Source: Ambulatory Visit | Attending: Family Medicine | Admitting: Family Medicine

## 2013-09-14 ENCOUNTER — Encounter (HOSPITAL_COMMUNITY): Payer: Self-pay

## 2013-09-14 DIAGNOSIS — R51 Headache: Secondary | ICD-10-CM | POA: Insufficient documentation

## 2013-09-14 DIAGNOSIS — R519 Headache, unspecified: Secondary | ICD-10-CM

## 2013-10-26 ENCOUNTER — Encounter: Payer: Self-pay | Admitting: *Deleted

## 2013-10-27 ENCOUNTER — Ambulatory Visit: Payer: BC Managed Care – PPO | Admitting: Internal Medicine

## 2013-12-07 ENCOUNTER — Encounter: Payer: Self-pay | Admitting: *Deleted

## 2014-01-18 ENCOUNTER — Other Ambulatory Visit (HOSPITAL_COMMUNITY): Payer: Self-pay | Admitting: Family Medicine

## 2014-01-18 DIAGNOSIS — Z1231 Encounter for screening mammogram for malignant neoplasm of breast: Secondary | ICD-10-CM

## 2014-02-09 ENCOUNTER — Ambulatory Visit (HOSPITAL_COMMUNITY)
Admission: RE | Admit: 2014-02-09 | Discharge: 2014-02-09 | Disposition: A | Discharge status: Home / Self Care | Payer: 59 | Source: Ambulatory Visit | Attending: Family Medicine | Admitting: Family Medicine

## 2014-02-09 DIAGNOSIS — Z1231 Encounter for screening mammogram for malignant neoplasm of breast: Secondary | ICD-10-CM | POA: Insufficient documentation

## 2014-03-01 IMAGING — CT CT ABD-PELV W/O CM
1 of 2 series · 15 of 32 positions shown, 19 images · non-contrast
Comparison: None.

CLINICAL DATA: Right flank pain.  Dysuria.  History of uterine
fibroids.

CT ABDOMEN AND PELVIS WITHOUT CONTRAST 04/27/2011:
TECHNIQUE: Multidetector CT imaging of the abdomen and pelvis was
performed following the standard protocol without intravenous
contrast.

[Series 2: abd/pel w/o · axial · non-contrast · 0.68mm/px · z∈[+1000,+1400]mm · 15 of 88 slices shown, 19 images]
[im 4/88  soft-tissue]
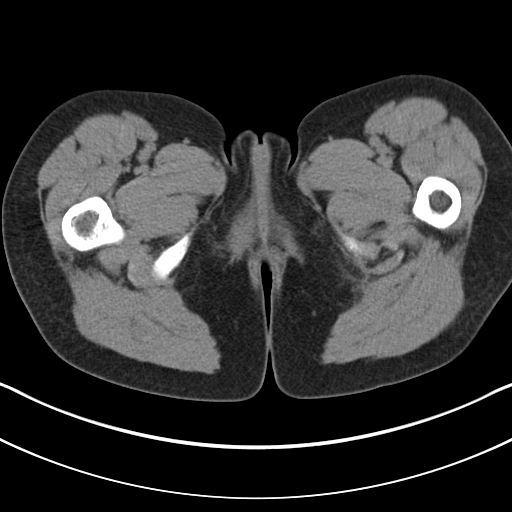
[im 4/88  bone]
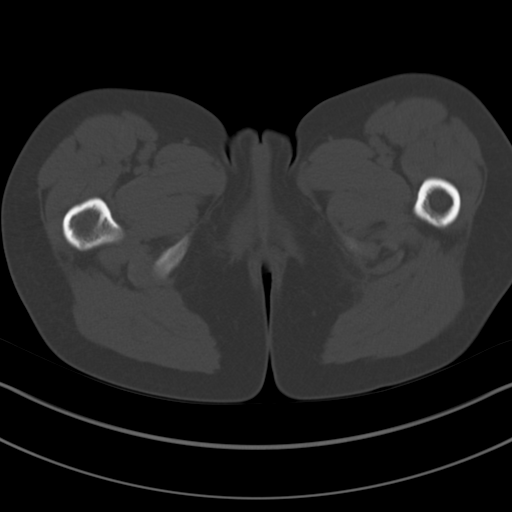
[im 11/88  soft-tissue]
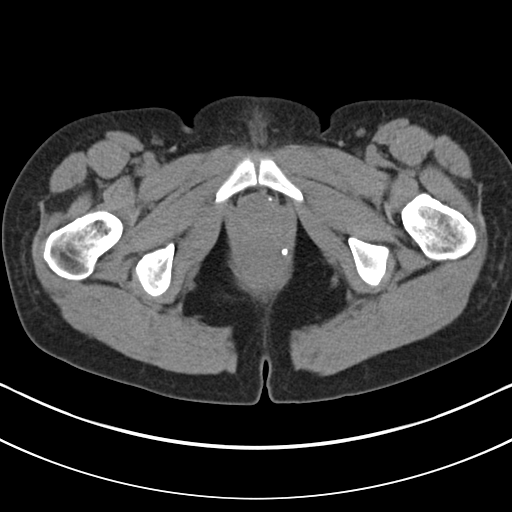
[im 18/88  soft-tissue]
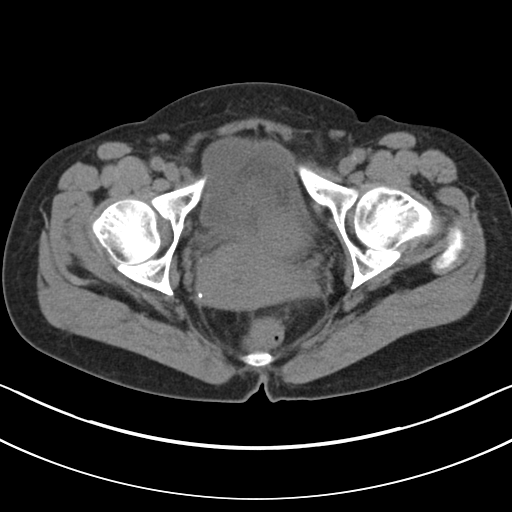
[im 25/88  soft-tissue]
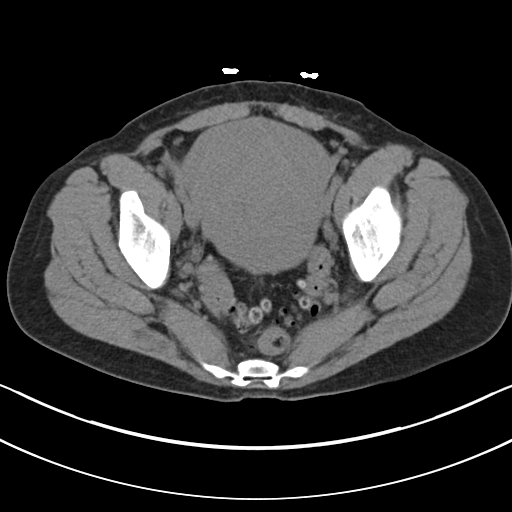
[im 32/88  soft-tissue]
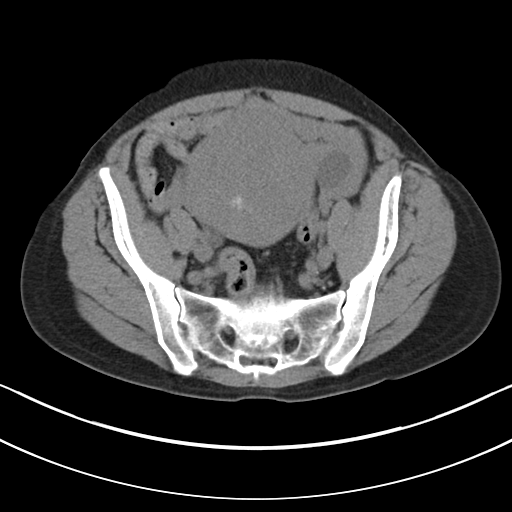
[im 39/88  soft-tissue]
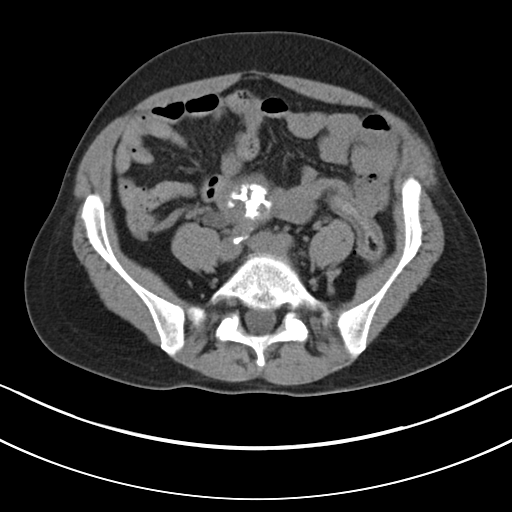
[im 46/88  soft-tissue]
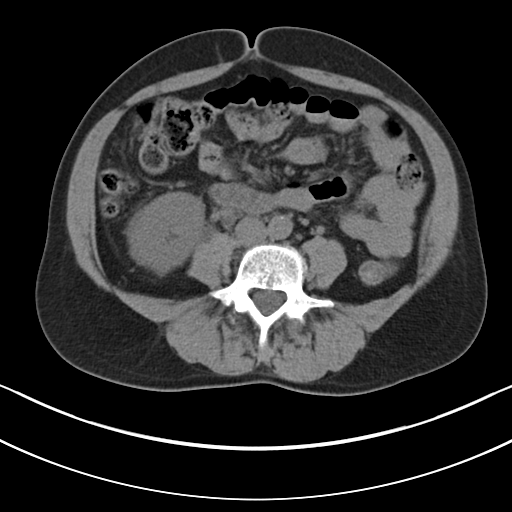
[im 49/88  soft-tissue]
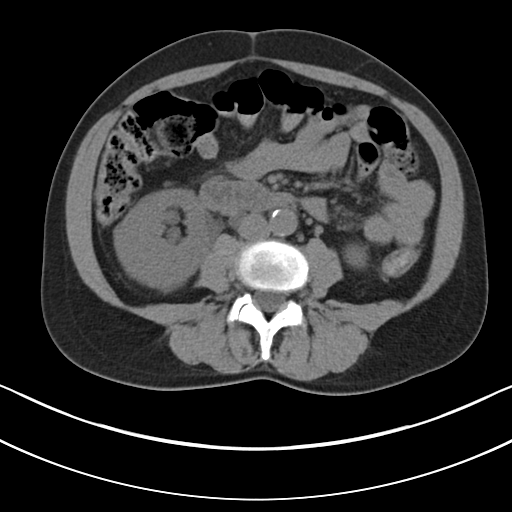
[im 56/88  soft-tissue]
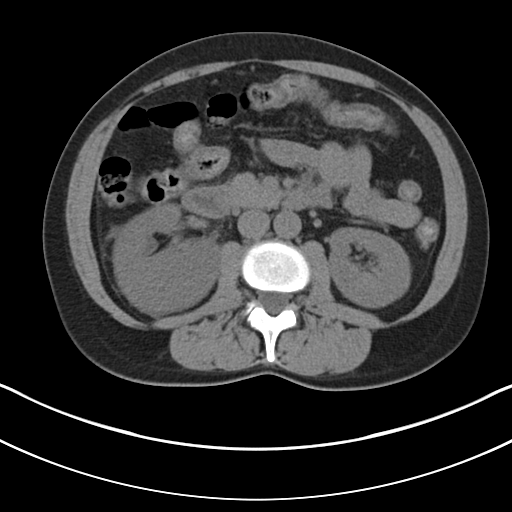
[im 56/88  bone]
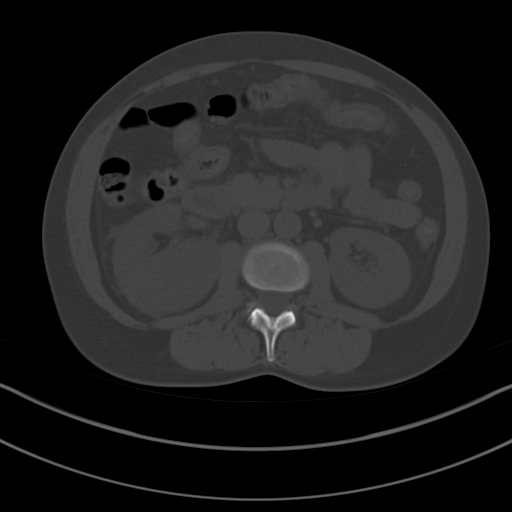
[im 63/88  soft-tissue]
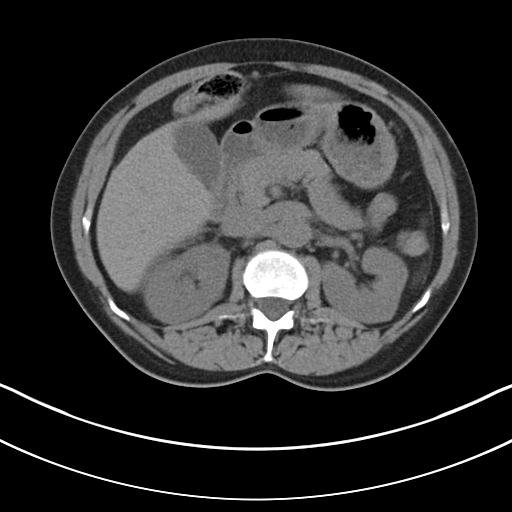
[im 70/88  soft-tissue]
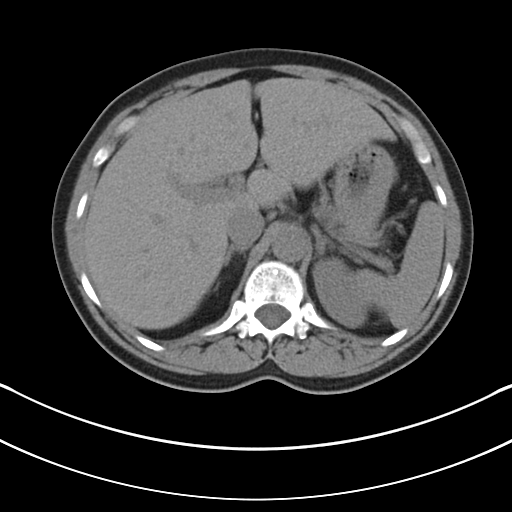
[im 74/88  lung]
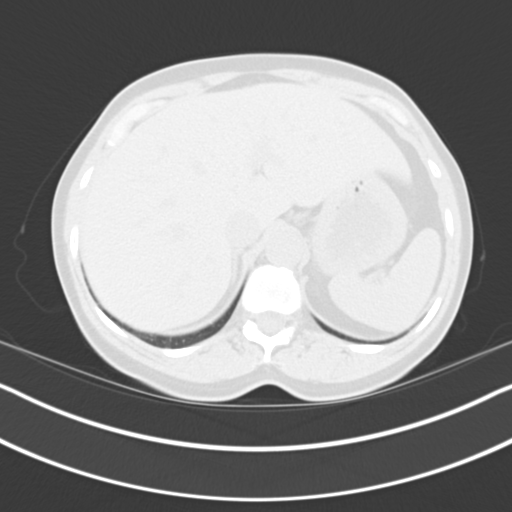
[im 77/88  soft-tissue]
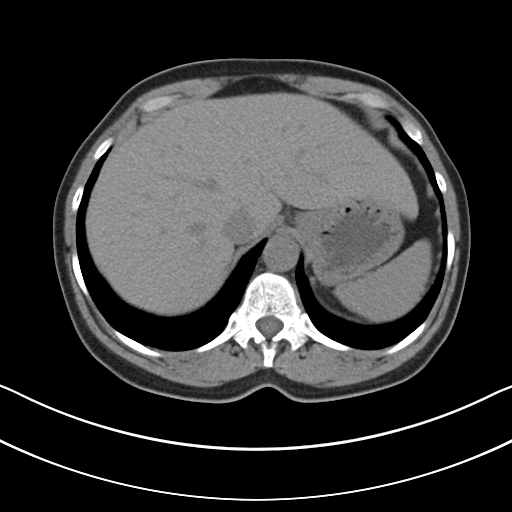
[im 77/88  lung]
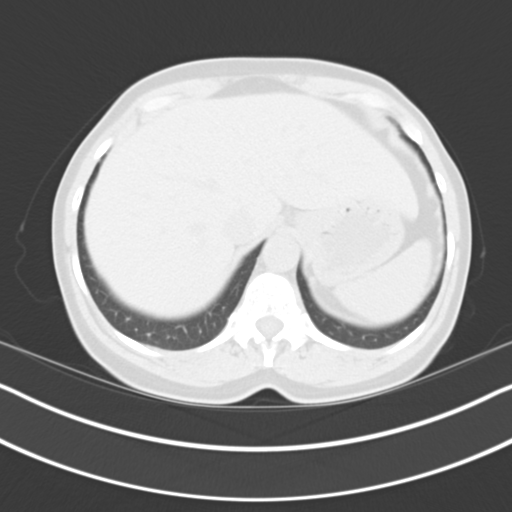
[im 81/88  lung]
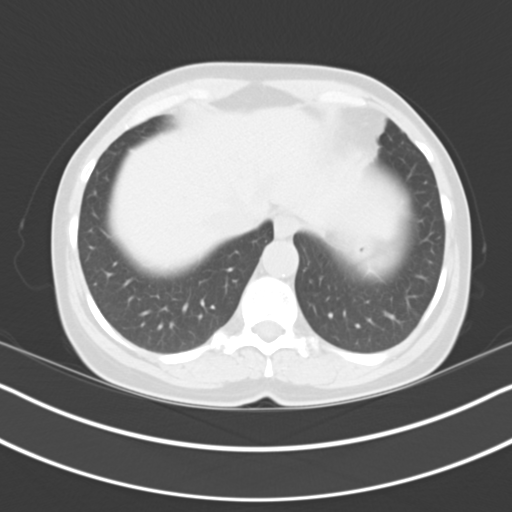
[im 84/88  soft-tissue]
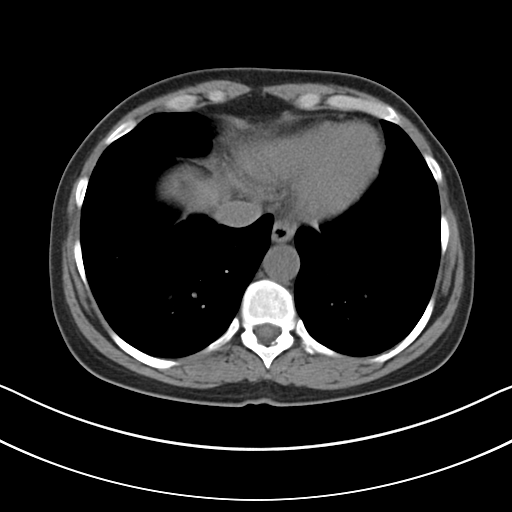
[im 84/88  lung]
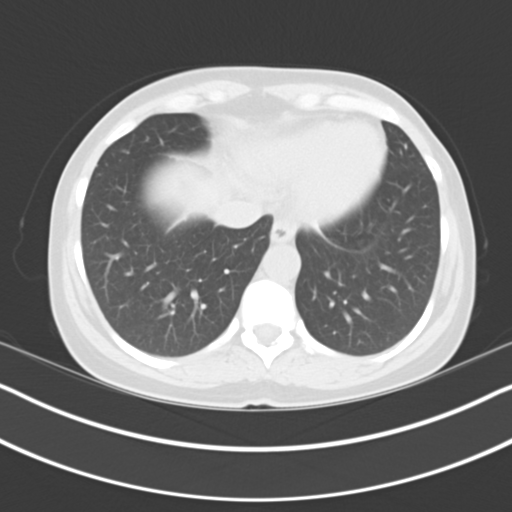

[15 of 32 positions shown; findings below may reference images not displayed]

FINDINGS: Obstructing approximate 4 mm calculus at the right
ureterovesical junction causing moderate right hydronephrosis,
marked right renal edema, and marked right perinephric edema.  No
other right urinary tract calculi.  At least 4 calculi in lower
pole calyces of the left kidney, the largest measuring
approximately 5 x 9 mm.  No left ureteral calculi and no left
urinary tract obstruction.  Within the limits of the unenhanced
technique, no focal parenchymal abnormality involving either
kidney.

Normal unenhanced appearance of the liver, spleen, pancreas, and
adrenal glands.  Gallbladder unremarkable by CT.  No biliary ductal
dilation.  Mild to moderate aorto-iliofemoral atherosclerosis.  No
significant lymphadenopathy.

Stomach and small bowel normal in appearance.  Extensive sigmoid
colon diverticulosis without evidence of acute diverticulitis;
remainder of the colon unremarkable.  Normal appendix in the right
upper pelvis.  No ascites.

Uterus enlarged containing fibroids, including a calcified,
degenerated fibroid arising from the fundus.  No adnexal masses or
free pelvic fluid.  Phleboliths low in both sides of the pelvis.
Bone window images demonstrate degenerative changes in the facet
joints of the lower lumbar spine.  Visualized lung bases clear.
IMPRESSION: 1.  Obstructing approximate 4 mm calculus at the right UVJ.
2.  At least 4 calculi in lower pole calyces of the left kidney,
non-obstructive, the largest approximating 5 x 9 mm.
3.  Sigmoid colon diverticulosis without evidence of acute
diverticulitis.
4.  Enlarged uterus containing fibroids.

## 2014-12-16 ENCOUNTER — Encounter: Payer: Self-pay | Admitting: Family Medicine

## 2018-11-18 ENCOUNTER — Other Ambulatory Visit: Payer: Self-pay

## 2018-11-18 DIAGNOSIS — Z20822 Contact with and (suspected) exposure to covid-19: Secondary | ICD-10-CM

## 2018-11-20 LAB — NOVEL CORONAVIRUS, NAA: SARS-CoV-2, NAA: NOT DETECTED

## 2018-12-05 ENCOUNTER — Telehealth: Payer: Self-pay | Admitting: General Practice

## 2018-12-05 NOTE — Telephone Encounter (Signed)
Negative COVID results given. Patient results "NOT Detected." Caller expressed understanding. ° °

## 2021-01-09 ENCOUNTER — Other Ambulatory Visit: Payer: Self-pay | Admitting: Family Medicine

## 2021-01-09 DIAGNOSIS — Z87891 Personal history of nicotine dependence: Secondary | ICD-10-CM

## 2022-08-28 DIAGNOSIS — Z1231 Encounter for screening mammogram for malignant neoplasm of breast: Secondary | ICD-10-CM | POA: Diagnosis not present

## 2022-08-28 DIAGNOSIS — I10 Essential (primary) hypertension: Secondary | ICD-10-CM | POA: Diagnosis not present

## 2022-08-28 DIAGNOSIS — F418 Other specified anxiety disorders: Secondary | ICD-10-CM | POA: Diagnosis not present

## 2022-08-28 DIAGNOSIS — J309 Allergic rhinitis, unspecified: Secondary | ICD-10-CM | POA: Diagnosis not present

## 2022-08-28 DIAGNOSIS — R7303 Prediabetes: Secondary | ICD-10-CM | POA: Diagnosis not present

## 2022-08-28 DIAGNOSIS — Z1331 Encounter for screening for depression: Secondary | ICD-10-CM | POA: Diagnosis not present

## 2022-08-28 DIAGNOSIS — E7800 Pure hypercholesterolemia, unspecified: Secondary | ICD-10-CM | POA: Diagnosis not present

## 2023-03-05 DIAGNOSIS — I10 Essential (primary) hypertension: Secondary | ICD-10-CM | POA: Diagnosis not present

## 2023-03-05 DIAGNOSIS — E7800 Pure hypercholesterolemia, unspecified: Secondary | ICD-10-CM | POA: Diagnosis not present

## 2023-03-05 DIAGNOSIS — R7303 Prediabetes: Secondary | ICD-10-CM | POA: Diagnosis not present

## 2023-03-05 DIAGNOSIS — J309 Allergic rhinitis, unspecified: Secondary | ICD-10-CM | POA: Diagnosis not present

## 2023-03-05 DIAGNOSIS — F418 Other specified anxiety disorders: Secondary | ICD-10-CM | POA: Diagnosis not present

## 2023-03-05 DIAGNOSIS — Z87891 Personal history of nicotine dependence: Secondary | ICD-10-CM | POA: Diagnosis not present

## 2023-03-12 ENCOUNTER — Other Ambulatory Visit: Payer: Self-pay | Admitting: Family Medicine

## 2023-03-12 DIAGNOSIS — Z87891 Personal history of nicotine dependence: Secondary | ICD-10-CM

## 2023-10-29 ENCOUNTER — Other Ambulatory Visit: Payer: Self-pay | Admitting: Family Medicine

## 2023-10-29 DIAGNOSIS — Z1231 Encounter for screening mammogram for malignant neoplasm of breast: Secondary | ICD-10-CM
